# Patient Record
Sex: Female | Born: 1985
Health system: Southern US, Community
[De-identification: ages and names within clinical notes are randomized; demographics above are authoritative.]

## PROBLEM LIST (undated history)

## (undated) DIAGNOSIS — K8020 Calculus of gallbladder without cholecystitis without obstruction: Secondary | ICD-10-CM

## (undated) DIAGNOSIS — I10 Essential (primary) hypertension: Secondary | ICD-10-CM

## (undated) HISTORY — DX: Calculus of gallbladder without cholecystitis without obstruction: K80.20

## (undated) HISTORY — DX: Essential (primary) hypertension: I10

## (undated) HISTORY — PX: NO PAST SURGERIES: SHX2092

## (undated) HISTORY — PX: UPPER GASTROINTESTINAL ENDOSCOPY: SHX188

---

## 2004-10-20 ENCOUNTER — Encounter (INDEPENDENT_AMBULATORY_CARE_PROVIDER_SITE_OTHER): Payer: Self-pay | Admitting: *Deleted

## 2004-10-20 LAB — CONVERTED CEMR LAB

## 2004-10-21 ENCOUNTER — Inpatient Hospital Stay (HOSPITAL_COMMUNITY): Admission: AD | Admit: 2004-10-21 | Discharge: 2004-10-21 | Payer: Self-pay | Admitting: Obstetrics & Gynecology

## 2004-10-25 ENCOUNTER — Ambulatory Visit: Payer: Self-pay | Admitting: Family Medicine

## 2004-11-01 ENCOUNTER — Ambulatory Visit: Payer: Self-pay | Admitting: Sports Medicine

## 2004-11-04 ENCOUNTER — Ambulatory Visit (HOSPITAL_COMMUNITY): Admission: RE | Admit: 2004-11-04 | Discharge: 2004-11-04 | Payer: Self-pay | Admitting: Family Medicine

## 2004-12-25 ENCOUNTER — Ambulatory Visit: Payer: Self-pay | Admitting: Family Medicine

## 2005-01-23 ENCOUNTER — Ambulatory Visit: Payer: Self-pay | Admitting: Family Medicine

## 2005-02-06 ENCOUNTER — Ambulatory Visit: Payer: Self-pay | Admitting: Family Medicine

## 2005-02-25 ENCOUNTER — Ambulatory Visit: Payer: Self-pay | Admitting: Family Medicine

## 2005-03-10 ENCOUNTER — Ambulatory Visit: Payer: Self-pay | Admitting: Family Medicine

## 2005-03-21 ENCOUNTER — Ambulatory Visit: Payer: Self-pay | Admitting: Family Medicine

## 2005-03-26 ENCOUNTER — Ambulatory Visit: Payer: Self-pay | Admitting: Family Medicine

## 2005-04-02 ENCOUNTER — Ambulatory Visit: Payer: Self-pay | Admitting: Family Medicine

## 2005-04-05 ENCOUNTER — Ambulatory Visit: Payer: Self-pay | Admitting: Obstetrics and Gynecology

## 2005-04-05 ENCOUNTER — Inpatient Hospital Stay (HOSPITAL_COMMUNITY): Admission: AD | Admit: 2005-04-05 | Discharge: 2005-04-08 | Payer: Self-pay | Admitting: *Deleted

## 2005-05-23 ENCOUNTER — Ambulatory Visit: Payer: Self-pay | Admitting: Family Medicine

## 2005-07-28 ENCOUNTER — Ambulatory Visit: Payer: Self-pay | Admitting: Family Medicine

## 2005-08-07 ENCOUNTER — Ambulatory Visit: Payer: Self-pay | Admitting: Family Medicine

## 2005-09-14 ENCOUNTER — Emergency Department (HOSPITAL_COMMUNITY): Admission: EM | Admit: 2005-09-14 | Discharge: 2005-09-14 | Payer: Self-pay | Admitting: Family Medicine

## 2005-11-19 ENCOUNTER — Ambulatory Visit: Payer: Self-pay | Admitting: Family Medicine

## 2006-12-18 ENCOUNTER — Encounter (INDEPENDENT_AMBULATORY_CARE_PROVIDER_SITE_OTHER): Payer: Self-pay | Admitting: *Deleted

## 2007-12-21 ENCOUNTER — Ambulatory Visit: Payer: Self-pay | Admitting: Internal Medicine

## 2008-02-02 ENCOUNTER — Encounter (INDEPENDENT_AMBULATORY_CARE_PROVIDER_SITE_OTHER): Payer: Self-pay | Admitting: Nurse Practitioner

## 2008-02-02 ENCOUNTER — Ambulatory Visit: Payer: Self-pay | Admitting: *Deleted

## 2008-02-02 ENCOUNTER — Ambulatory Visit: Payer: Self-pay | Admitting: Family Medicine

## 2008-02-02 LAB — CONVERTED CEMR LAB
ALT: 10 units/L (ref 0–35)
AST: 17 units/L (ref 0–37)
Albumin: 4.9 g/dL (ref 3.5–5.2)
Alkaline Phosphatase: 50 units/L (ref 39–117)
BUN: 10 mg/dL (ref 6–23)
Basophils Absolute: 0 10*3/uL (ref 0.0–0.1)
Basophils Relative: 0 % (ref 0–1)
CO2: 20 meq/L (ref 19–32)
Calcium: 9.7 mg/dL (ref 8.4–10.5)
Chloride: 106 meq/L (ref 96–112)
Creatinine, Ser: 0.58 mg/dL (ref 0.40–1.20)
Eosinophils Absolute: 0.2 10*3/uL (ref 0.0–0.7)
Eosinophils Relative: 3 % (ref 0–5)
Glucose, Bld: 78 mg/dL (ref 70–99)
HCT: 43.4 % (ref 36.0–46.0)
Hemoglobin: 14.6 g/dL (ref 12.0–15.0)
Lymphocytes Relative: 29 % (ref 12–46)
Lymphs Abs: 1.7 10*3/uL (ref 0.7–4.0)
MCHC: 33.6 g/dL (ref 30.0–36.0)
MCV: 90.8 fL (ref 78.0–100.0)
Monocytes Absolute: 0.4 10*3/uL (ref 0.1–1.0)
Monocytes Relative: 7 % (ref 3–12)
Neutro Abs: 3.7 10*3/uL (ref 1.7–7.7)
Neutrophils Relative %: 61 % (ref 43–77)
Platelets: 209 10*3/uL (ref 150–400)
Potassium: 4 meq/L (ref 3.5–5.3)
RBC: 4.78 M/uL (ref 3.87–5.11)
RDW: 13.5 % (ref 11.5–15.5)
Sodium: 141 meq/L (ref 135–145)
TSH: 1.836 microintl units/mL (ref 0.350–5.50)
Total Bilirubin: 0.6 mg/dL (ref 0.3–1.2)
Total Protein: 7.9 g/dL (ref 6.0–8.3)
WBC: 6 10*3/uL (ref 4.0–10.5)

## 2008-02-03 ENCOUNTER — Encounter (INDEPENDENT_AMBULATORY_CARE_PROVIDER_SITE_OTHER): Payer: Self-pay | Admitting: Nurse Practitioner

## 2008-02-07 ENCOUNTER — Ambulatory Visit: Payer: Self-pay | Admitting: Internal Medicine

## 2008-03-21 ENCOUNTER — Ambulatory Visit: Payer: Self-pay | Admitting: Internal Medicine

## 2008-04-14 ENCOUNTER — Ambulatory Visit: Payer: Self-pay | Admitting: Internal Medicine

## 2008-10-04 ENCOUNTER — Encounter (INDEPENDENT_AMBULATORY_CARE_PROVIDER_SITE_OTHER): Payer: Self-pay | Admitting: Adult Health

## 2008-10-04 ENCOUNTER — Ambulatory Visit: Payer: Self-pay | Admitting: Internal Medicine

## 2008-10-04 LAB — CONVERTED CEMR LAB
ALT: 12 units/L (ref 0–35)
AST: 15 units/L (ref 0–37)
Albumin: 5.2 g/dL (ref 3.5–5.2)
Alkaline Phosphatase: 51 units/L (ref 39–117)
BUN: 11 mg/dL (ref 6–23)
Basophils Absolute: 0 10*3/uL (ref 0.0–0.1)
Basophils Relative: 0 % (ref 0–1)
CO2: 22 meq/L (ref 19–32)
Calcium: 9.8 mg/dL (ref 8.4–10.5)
Chloride: 103 meq/L (ref 96–112)
Creatinine, Ser: 0.53 mg/dL (ref 0.40–1.20)
Eosinophils Absolute: 0.2 10*3/uL (ref 0.0–0.7)
Eosinophils Relative: 3 % (ref 0–5)
Glucose, Bld: 76 mg/dL (ref 70–99)
HCT: 44 % (ref 36.0–46.0)
Hemoglobin: 14.7 g/dL (ref 12.0–15.0)
Lymphocytes Relative: 31 % (ref 12–46)
Lymphs Abs: 1.9 10*3/uL (ref 0.7–4.0)
MCHC: 33.4 g/dL (ref 30.0–36.0)
MCV: 89.1 fL (ref 78.0–100.0)
Mono Screen: NEGATIVE
Monocytes Absolute: 0.5 10*3/uL (ref 0.1–1.0)
Monocytes Relative: 8 % (ref 3–12)
Neutro Abs: 3.5 10*3/uL (ref 1.7–7.7)
Neutrophils Relative %: 58 % (ref 43–77)
Platelets: 238 10*3/uL (ref 150–400)
Potassium: 3.9 meq/L (ref 3.5–5.3)
RBC: 4.94 M/uL (ref 3.87–5.11)
RDW: 13.1 % (ref 11.5–15.5)
Sodium: 140 meq/L (ref 135–145)
Total Bilirubin: 0.4 mg/dL (ref 0.3–1.2)
Total Protein: 8.4 g/dL — ABNORMAL HIGH (ref 6.0–8.3)
WBC: 6 10*3/uL (ref 4.0–10.5)

## 2008-10-17 ENCOUNTER — Ambulatory Visit: Payer: Self-pay | Admitting: Internal Medicine

## 2008-10-18 ENCOUNTER — Ambulatory Visit (HOSPITAL_COMMUNITY): Admission: RE | Admit: 2008-10-18 | Discharge: 2008-10-18 | Payer: Self-pay | Admitting: Family Medicine

## 2009-02-27 ENCOUNTER — Encounter (INDEPENDENT_AMBULATORY_CARE_PROVIDER_SITE_OTHER): Payer: Self-pay | Admitting: Adult Health

## 2009-02-27 ENCOUNTER — Ambulatory Visit: Payer: Self-pay | Admitting: Internal Medicine

## 2009-08-15 ENCOUNTER — Telehealth (INDEPENDENT_AMBULATORY_CARE_PROVIDER_SITE_OTHER): Payer: Self-pay | Admitting: Adult Health

## 2009-08-29 ENCOUNTER — Emergency Department (HOSPITAL_COMMUNITY): Admission: EM | Admit: 2009-08-29 | Discharge: 2009-08-29 | Payer: Self-pay | Admitting: Family Medicine

## 2009-09-09 ENCOUNTER — Emergency Department (HOSPITAL_COMMUNITY): Admission: EM | Admit: 2009-09-09 | Discharge: 2009-09-09 | Payer: Self-pay | Admitting: Family Medicine

## 2009-09-19 ENCOUNTER — Ambulatory Visit: Payer: Self-pay | Admitting: Internal Medicine

## 2009-09-19 ENCOUNTER — Encounter (INDEPENDENT_AMBULATORY_CARE_PROVIDER_SITE_OTHER): Payer: Self-pay | Admitting: Adult Health

## 2009-09-19 ENCOUNTER — Emergency Department (HOSPITAL_COMMUNITY): Admission: EM | Admit: 2009-09-19 | Discharge: 2009-09-19 | Payer: Self-pay | Admitting: Family Medicine

## 2009-09-19 LAB — CONVERTED CEMR LAB
ALT: 16 units/L (ref 0–35)
AST: 16 units/L (ref 0–37)
Albumin: 5.4 g/dL — ABNORMAL HIGH (ref 3.5–5.2)
Alkaline Phosphatase: 58 units/L (ref 39–117)
BUN: 12 mg/dL (ref 6–23)
Basophils Absolute: 0 10*3/uL (ref 0.0–0.1)
Basophils Relative: 0 % (ref 0–1)
CO2: 22 meq/L (ref 19–32)
Calcium: 10 mg/dL (ref 8.4–10.5)
Chloride: 101 meq/L (ref 96–112)
Creatinine, Ser: 0.62 mg/dL (ref 0.40–1.20)
Eosinophils Absolute: 0 10*3/uL (ref 0.0–0.7)
Eosinophils Relative: 0 % (ref 0–5)
Glucose, Bld: 73 mg/dL (ref 70–99)
HCT: 45.3 % (ref 36.0–46.0)
Hemoglobin: 15.1 g/dL — ABNORMAL HIGH (ref 12.0–15.0)
Lymphocytes Relative: 6 % — ABNORMAL LOW (ref 12–46)
Lymphs Abs: 0.9 10*3/uL (ref 0.7–4.0)
MCHC: 33.3 g/dL (ref 30.0–36.0)
MCV: 89.3 fL (ref 78.0–100.0)
Monocytes Absolute: 0.9 10*3/uL (ref 0.1–1.0)
Monocytes Relative: 7 % (ref 3–12)
Neutro Abs: 12.2 10*3/uL — ABNORMAL HIGH (ref 1.7–7.7)
Neutrophils Relative %: 87 % — ABNORMAL HIGH (ref 43–77)
Platelets: 208 10*3/uL (ref 150–400)
Potassium: 4 meq/L (ref 3.5–5.3)
RBC: 5.07 M/uL (ref 3.87–5.11)
RDW: 13 % (ref 11.5–15.5)
Sodium: 138 meq/L (ref 135–145)
TSH: 0.741 microintl units/mL (ref 0.350–4.500)
Total Bilirubin: 0.6 mg/dL (ref 0.3–1.2)
Total Protein: 8.3 g/dL (ref 6.0–8.3)
WBC: 14.1 10*3/uL — ABNORMAL HIGH (ref 4.0–10.5)

## 2009-09-26 ENCOUNTER — Ambulatory Visit: Payer: Self-pay | Admitting: Internal Medicine

## 2009-10-02 ENCOUNTER — Encounter (INDEPENDENT_AMBULATORY_CARE_PROVIDER_SITE_OTHER): Payer: Self-pay | Admitting: Adult Health

## 2009-10-02 ENCOUNTER — Ambulatory Visit: Payer: Self-pay | Admitting: Internal Medicine

## 2009-10-02 LAB — CONVERTED CEMR LAB
Basophils Absolute: 0 10*3/uL (ref 0.0–0.1)
Basophils Relative: 0 % (ref 0–1)
Eosinophils Absolute: 0.1 10*3/uL (ref 0.0–0.7)
Eosinophils Relative: 1 % (ref 0–5)
HCT: 43.3 % (ref 36.0–46.0)
Hemoglobin: 14.4 g/dL (ref 12.0–15.0)
Lymphocytes Relative: 18 % (ref 12–46)
Lymphs Abs: 1.6 10*3/uL (ref 0.7–4.0)
MCHC: 33.3 g/dL (ref 30.0–36.0)
MCV: 90 fL (ref 78.0–100.0)
Monocytes Absolute: 0.4 10*3/uL (ref 0.1–1.0)
Monocytes Relative: 4 % (ref 3–12)
Neutro Abs: 7 10*3/uL (ref 1.7–7.7)
Neutrophils Relative %: 76 % (ref 43–77)
Platelets: 243 10*3/uL (ref 150–400)
RBC: 4.81 M/uL (ref 3.87–5.11)
RDW: 13.2 % (ref 11.5–15.5)
WBC: 9.2 10*3/uL (ref 4.0–10.5)

## 2009-10-17 ENCOUNTER — Ambulatory Visit: Payer: Self-pay | Admitting: Internal Medicine

## 2009-12-07 ENCOUNTER — Ambulatory Visit: Payer: Self-pay | Admitting: Internal Medicine

## 2010-01-02 ENCOUNTER — Ambulatory Visit: Payer: Self-pay | Admitting: Internal Medicine

## 2010-01-22 ENCOUNTER — Ambulatory Visit: Payer: Self-pay | Admitting: Internal Medicine

## 2010-02-06 ENCOUNTER — Ambulatory Visit: Payer: Self-pay | Admitting: Internal Medicine

## 2010-02-20 ENCOUNTER — Ambulatory Visit: Payer: Self-pay | Admitting: Internal Medicine

## 2010-02-22 ENCOUNTER — Ambulatory Visit (HOSPITAL_COMMUNITY): Admission: RE | Admit: 2010-02-22 | Discharge: 2010-02-22 | Payer: Self-pay | Admitting: Internal Medicine

## 2010-05-01 ENCOUNTER — Ambulatory Visit: Payer: Self-pay | Admitting: Internal Medicine

## 2010-08-01 IMAGING — CR DG CHEST 2V
2 series · 2 of 2 positions shown · non-contrast
Comparison: None

CLINICAL DATA: Cervical lymphadenopathy

CHEST - 2 VIEW

[w chest pa]
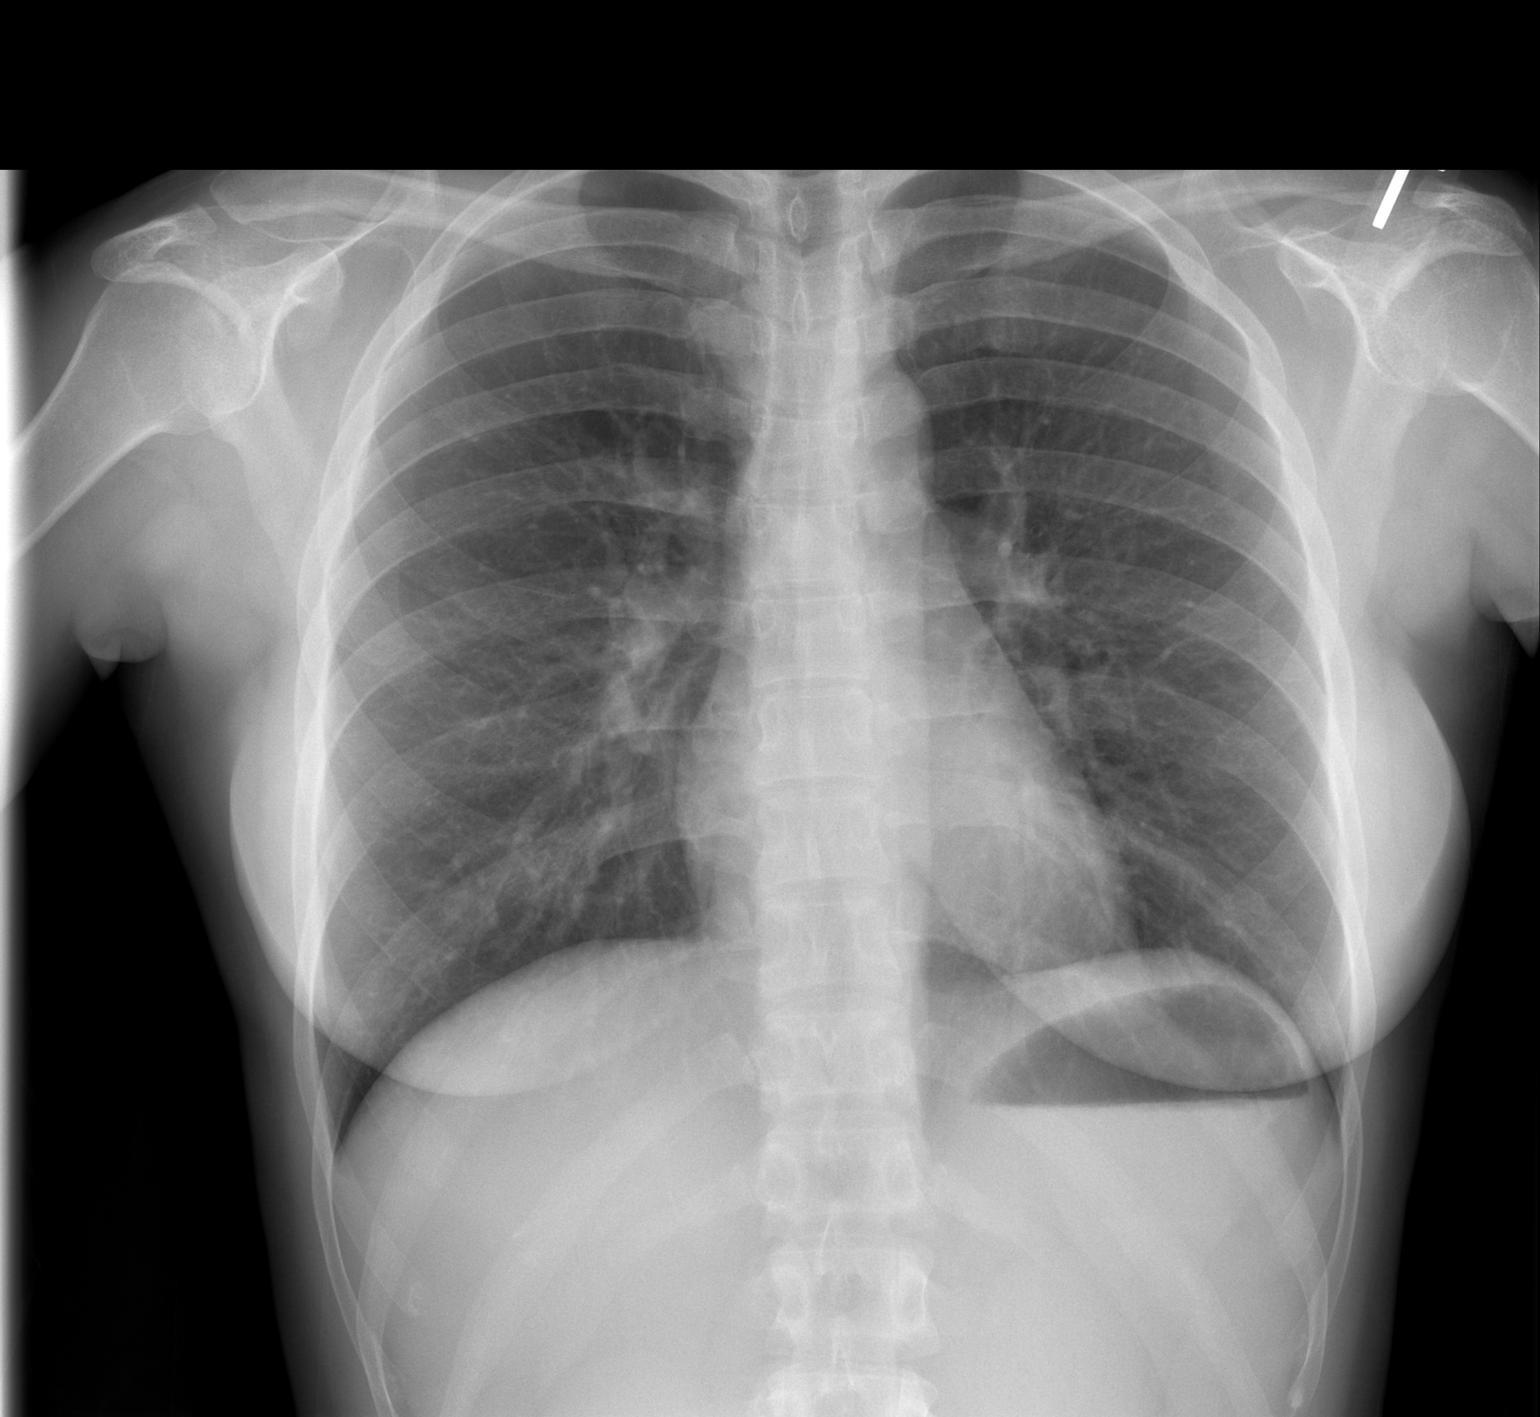

[w chest lat]
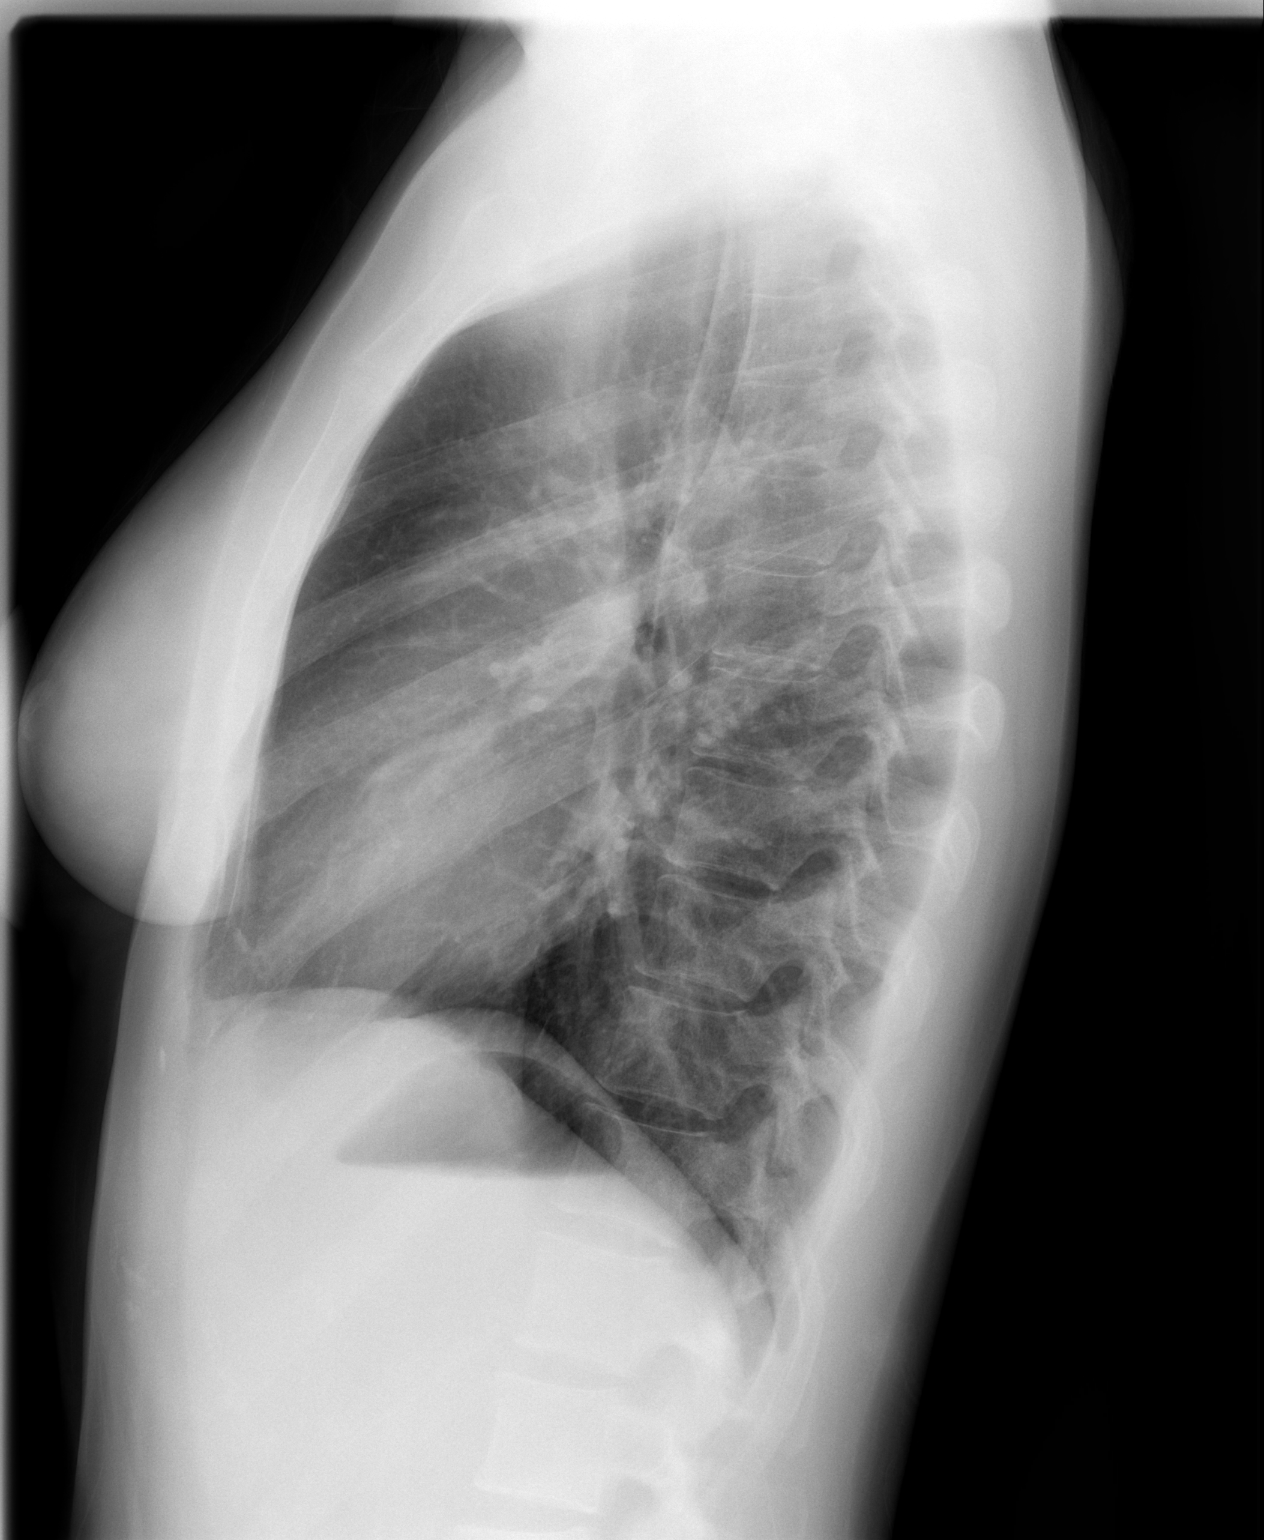

[2 of 2 positions shown; findings below may reference images not displayed]

FINDINGS: Normal mediastinum and cardiac silhouette.  Costophrenic
angles are clear.  No evidence of effusion, infiltrate, or
pneumothorax.
IMPRESSION: No acute cardiopulmonary process.

## 2010-08-13 ENCOUNTER — Encounter: Payer: Self-pay | Admitting: Family Medicine

## 2010-08-13 ENCOUNTER — Ambulatory Visit: Payer: Self-pay | Admitting: Family Medicine

## 2010-08-13 LAB — CONVERTED CEMR LAB
Antibody Screen: NEGATIVE
Basophils Absolute: 0 10*3/uL (ref 0.0–0.1)
Basophils Relative: 0 % (ref 0–1)
Eosinophils Absolute: 0.1 10*3/uL (ref 0.0–0.7)
Eosinophils Relative: 1 % (ref 0–5)
HCT: 40.5 % (ref 36.0–46.0)
HIV: NONREACTIVE
Hemoglobin: 14.1 g/dL (ref 12.0–15.0)
Hepatitis B Surface Ag: NEGATIVE
Lymphocytes Relative: 13 % (ref 12–46)
Lymphs Abs: 1.2 10*3/uL (ref 0.7–4.0)
MCHC: 34.8 g/dL (ref 30.0–36.0)
MCV: 88 fL (ref 78.0–100.0)
Monocytes Absolute: 0.5 10*3/uL (ref 0.1–1.0)
Monocytes Relative: 5 % (ref 3–12)
Neutro Abs: 7.4 10*3/uL (ref 1.7–7.7)
Neutrophils Relative %: 81 % — ABNORMAL HIGH (ref 43–77)
Platelets: 206 10*3/uL (ref 150–400)
RBC: 4.6 M/uL (ref 3.87–5.11)
RDW: 12.9 % (ref 11.5–15.5)
Rh Type: POSITIVE
Rubella: 161.8 intl units/mL — ABNORMAL HIGH
Sickle Cell Screen: NEGATIVE
WBC: 9.1 10*3/uL (ref 4.0–10.5)

## 2010-08-13 LAB — CBC
Hemoglobin: 14.1 g/dL (ref 12.0–16.0)
Platelets: 206 10*3/uL (ref 150–399)

## 2010-08-13 LAB — RPR

## 2010-08-20 ENCOUNTER — Encounter: Payer: Self-pay | Admitting: Family Medicine

## 2010-08-20 ENCOUNTER — Ambulatory Visit: Payer: Self-pay | Admitting: Family Medicine

## 2010-08-20 DIAGNOSIS — N760 Acute vaginitis: Secondary | ICD-10-CM | POA: Insufficient documentation

## 2010-08-20 LAB — CONVERTED CEMR LAB
Chlamydia, DNA Probe: NEGATIVE
GC Probe Amp, Genital: NEGATIVE
Whiff Test: NEGATIVE

## 2010-08-20 LAB — VARICELLA ZOSTER ANTIBODY, IGG: Varicella IgG: IMMUNE

## 2010-08-27 ENCOUNTER — Ambulatory Visit (HOSPITAL_COMMUNITY): Admission: RE | Admit: 2010-08-27 | Discharge: 2010-08-27 | Payer: Self-pay | Admitting: Family Medicine

## 2010-09-17 ENCOUNTER — Ambulatory Visit: Payer: Self-pay | Admitting: Family Medicine

## 2010-09-19 ENCOUNTER — Ambulatory Visit (HOSPITAL_COMMUNITY): Admission: RE | Admit: 2010-09-19 | Discharge: 2010-09-19 | Payer: Self-pay | Admitting: Family Medicine

## 2010-09-19 ENCOUNTER — Encounter: Payer: Self-pay | Admitting: Family Medicine

## 2010-10-15 ENCOUNTER — Ambulatory Visit: Payer: Self-pay | Admitting: Family Medicine

## 2010-10-22 ENCOUNTER — Ambulatory Visit: Admission: RE | Admit: 2010-10-22 | Discharge: 2010-10-22 | Payer: Self-pay | Source: Home / Self Care

## 2010-10-24 ENCOUNTER — Encounter: Payer: Self-pay | Admitting: Family Medicine

## 2010-10-24 ENCOUNTER — Ambulatory Visit: Admission: RE | Admit: 2010-10-24 | Discharge: 2010-10-24 | Payer: Self-pay | Source: Home / Self Care

## 2010-10-24 DIAGNOSIS — O99810 Abnormal glucose complicating pregnancy: Secondary | ICD-10-CM | POA: Insufficient documentation

## 2010-10-24 LAB — GLUCOSE, CAPILLARY: Glucose-Capillary: 85 mg/dL (ref 70–99)

## 2010-11-14 ENCOUNTER — Encounter: Payer: Self-pay | Admitting: Family Medicine

## 2010-11-14 ENCOUNTER — Ambulatory Visit: Admission: RE | Admit: 2010-11-14 | Discharge: 2010-11-14 | Payer: Self-pay | Source: Home / Self Care

## 2010-11-14 LAB — CONVERTED CEMR LAB
HCT: 36 % (ref 36.0–46.0)
HIV: NONREACTIVE
Hemoglobin: 12 g/dL (ref 12.0–15.0)
MCHC: 33.3 g/dL (ref 30.0–36.0)
MCV: 88.7 fL (ref 78.0–100.0)
Platelets: 198 10*3/uL (ref 150–400)
RBC: 4.06 M/uL (ref 3.87–5.11)
RDW: 13 % (ref 11.5–15.5)
WBC: 9.2 10*3/uL (ref 4.0–10.5)

## 2010-11-19 NOTE — Assessment & Plan Note (Signed)
Summary: ob visit/matthews pt/rescheduled from 11/28/eo   Vital Signs:  Patient profile:   25 year old female Weight:      129 pounds Temp:     98.3 degrees F oral Pulse rate:   86 / minute BP sitting:   120 / 77  (left arm) Cuff size:   regular  Vitals Entered By: Tessie Fass CMA (September 17, 2010 2:28 PM) CC: OB Visit 18.1   CC:  OB Visit 18.1.  Habits & Providers  Alcohol-Tobacco-Diet     Cigarette Packs/Day: n/a  Current Medications (verified): 1)  Pre-Natal Formula  Tabs (Prenatal Multivit-Min-Fe-Fa) .Marland Kitchen.. 1 Tab By Mouth Daily  Allergies (verified): No Known Drug Allergies   Impression & Recommendations:  Problem # 1:  PREGNANT STATE, INCIDENTAL (ICD-V22.2) Assessment Unchanged No concerns today. N/V improving. Reviewed labs and previous US. Ordered anatomy scan. Continue PNV. Declined QUAD screen. Follow up with PCP in 4 weeks. Consider early glucola. Orders: Other OB visit- [facility] (OBCK)  Complete Medication List: 1)  Pre-natal Formula Tabs (Prenatal multivit-min-fe-fa) .Marland Kitchen.. 1 tab by mouth daily  Other Orders: Prenatal U/S > 14 weeks - 25956 (Prenatal U/S)  Patient Instructions: 1)  Fue un placer conocerte hoy. Tambin programar el ultrasonido y la enfermera le har saber la fecha. Me gustara volver a verte en 4 semanas. Recuerde que si usted tiene alguna fuga de lquido o sangrado pesado ir al hospital [facility] [place].   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    Number of children at home: 1  FOB Information    Husband/Father of baby: Jose  Menstrual History    LMP (date): 05/13/2010    LMP - Character: normal    Menarche: 11 years    Menses interval: 28 days    Menstrual flow 5 days    On BCP's at conception:  no   Flowsheet View for Follow-up Visit    Estimated weeks of       gestation:     18.1    Weight:     129    Blood pressure:   120 / 77    Headache:     No    Nausea/vomiting:   vom1/d    Edema:     0    Vaginal bleeding:   no    Vaginal discharge:   no    Fundal height:      18    FHR:       140s    Fetal activity:     no    Labor symptoms:   no    Taking prenatal vits?   Y    Smoking:     n/a    Next visit:     4 wk    Resident:     Earlene Plater    Comment:     Doing well.    Flowsheet View for Follow-up Visit    Estimated weeks of       gestation:     18.1    Weight:     129    Blood pressure:   120 / 77    Hx headache?     No  Nausea/vomiting?   vom1/d    Edema?     0    Bleeding?     no    Leakage/discharge?   no    Fetal activity:       no    Labor symptoms?   no    Fundal height:      18    FHR:       140s    Taking Vitamins?   Y    Smoking PPD:   n/a    Comment:     Doing well.    Next visit:     4 wk    Resident:     Earlene Plater

## 2010-11-19 NOTE — Assessment & Plan Note (Signed)
Summary: NOB/AAM/DSL   Vital Signs:  Patient profile:   25 year old female LMP:     05/13/2010 Weight:      129 pounds Temp:     98.0 degrees F oral Pulse rate:   97 / minute BP sitting:   119 / 84  (left arm) Cuff size:   regular  Vitals Entered By: Jimmy Footman, CMA (August 20, 2010 2:24 PM)  Menstrual History Menarche (age onset-years):  11 Menses Interval (days):  28 Menstrual Flow (days):  5 CC: New OB Is Patient Diabetic? No Pain Assessment Patient in pain? no      LMP (date): 05/13/2010 EDC by LMP==> 02/17/2011 LMP - Character: normal LMP - Reliable? Yes Menarche (age onset years): 11   Menses interval (days): 28 Menstrual flow (days): 5 On BCP's at conception: no Enter LMP: 05/13/2010 Last PAP Result NEGATIVE FOR INTRAEPITHELIAL LESIONS OR MALIGNANCY.   CC:  New OB.  History of Present Illness: History obtained through interpreter. Patient here for new ob visit.  First day of LMP was 05/13/10.  Has had two positive pregnancy tests at home.  Has had some vomiting at home occasionally, does not currenly want anything for this.  Also has some upper airway congestion that started two days ago.  No fever/ chills, headache, shortness of breath, chest pain, abdominal pain.  Is taking an prenatal vitamin  Preventive Screening-Counseling & Management  Alcohol-Tobacco     Smoking Status: never     Packs/Day: n/a  Hep-HIV-STD-Contraception     Hepatitis Risk: no  Current Medications (verified): 1)  Pre-Natal Formula  Tabs (Prenatal Multivit-Min-Fe-Fa) .Marland Kitchen.. 1 Tab By Mouth Daily  Allergies (verified): No Known Drug Allergies  Past History:  Past Medical History: G2P1  Family History: Mother-arthritis Mother and father currently living  Social History: Lives w/boyfriend, son born 04/06/05; unemployed,no tobacco, no EtOHSmoking Status:  never Education:  High School Hepatitis Risk:  no Packs/Day:  n/a  Review of Systems       Pertinent positives and  negatives noted in HPI, Vitals signs noted   Physical Exam  General:  Well-developed,well-nourished,in no acute distress; alert,appropriate and cooperative throughout examination Head:  Normocephalic and atraumatic without obvious abnormalities. No apparent alopecia or balding. Eyes:  No corneal or conjunctival inflammation noted. EOMI. Perrla. Funduscopic exam benign, without hemorrhages, exudates or papilledema. Vision grossly normal. Ears:  External ear exam shows no significant lesions or deformities.  Otoscopic examination reveals clear canals, tympanic membranes are intact bilaterally without bulging, retraction, inflammation or discharge. Hearing is grossly normal bilaterally. Nose:  External nasal examination shows no deformity or inflammation. Nasal mucosa are pink and moist without lesions or exudates. Mouth:  Oral mucosa and oropharynx without lesions or exudates.  Teeth in good repair. Neck:  No deformities, masses, or tenderness noted. Lungs:  Normal respiratory effort, chest expands symmetrically. Lungs are clear to auscultation, no crackles or wheezes. Heart:  Normal rate and regular rhythm. S1 and S2 normal without gallop, murmur, click, rub or other extra sounds. Abdomen:  Gravid with uterus 5cm below umbilicus, non tender, no organomegaly, BS+ Genitalia:  Pelvic Exam:        External: normal female genitalia without lesions or masses        Vagina: Mild white discharge        Cervix:  friable without lesions        Adnexa: normal bimanual exam without masses or fullness        Uterus: Gravid  Pap smear: performed Pulses:  R and L carotid,radial,femoral,dorsalis pedis and posterior tibial pulses are full and equal bilaterally Extremities:  No clubbing, cyanosis, edema, or deformity noted with normal full range of motion of all joints.   Neurologic:  No cranial nerve deficits noted. Station and gait are normal. Plantar reflexes are down-going bilaterally. DTRs are  symmetrical throughout. Sensory, motor and coordinative functions appear intact. Skin:  Intact without suspicious lesions or rashes Psych:  Cognition and judgment appear intact. Alert and cooperative with normal attention span and concentration. No apparent delusions, illusions, hallucinations   Impression & Recommendations:  Problem # 1:  PREGNANT STATE, INCIDENTAL (ICD-V22.2) New OB patient at 14wk1day by LMP, intake and history done today as well as testing for GC/Chlamydia, wet prep for vaginal discharge, pap, and varicella titer.  Fetus visualized on U/S with heartbeat.  Uterus consistent with dates.  Will get U/S to   Other prenatal labs wnl.  Declined any medication for n/v.  Taking prenatal vitamin, will have her f/u in 4 weeks. Orders: Miscellaneous Lab Charge-[facility] [phone]) Prenatal U/S > 14 weeks - [id number] (Prenatal U/S)  Complete Medication List: 1)  Pre-natal Formula Tabs (Prenatal multivit-min-fe-fa) .Marland Kitchen.. 1 tab by mouth daily  Other Orders: Wet Prep- [facility] [phone]) GC/Chlamydia-[facility] ([id number]) Pap Smear-[facility] [phone]) Flu Vaccine 46yrs + [phone]) Admin 1st Vaccine ([id number])  Patient Instructions: 1)  Fue un placer conocerte hoy. Yo le har saber los resultados de sus laboratorios. Tambin programar el ultrasonido y la enfermera le har saber la fecha. Me gustara volver a verte en 4 semanas. Recuerde que si usted tiene alguna fuga de lquido o sangrado pesado ir al hospital de la [facility].   Orders Added: 1)  Wet Prep- [facility] [[id number]] 2)  GC/Chlamydia-[facility] [87591/87491] 3)  Pap Smear-[facility] [[id number]] 4)  Miscellaneous Lab Charge-[facility] [99999] 5)  Prenatal U/S > 14 weeks - 01093 [Prenatal U/S] 6)  Flu Vaccine 47yrs + [90658] 7)  Admin 1st Vaccine [90471]   Immunizations Administered:  Influenza Vaccine # 1:    Vaccine Type: Fluvax 3+    Site: left deltoid    Mfr: GlaxoSmithKline    Dose: 0.5 ml    Route: IM    Given by: Jimmy Footman, CMA    Exp. Date: 04/16/2011    Lot #:  [id number]    VIS given: 05/14/10 version given August 20, 2010.  Flu Vaccine Consent Questions:    Do you have a history of severe allergic reactions to this vaccine? no    Any prior history of allergic reactions to egg and/or gelatin? no    Do you have a sensitivity to the preservative Thimersol? no    Do you have a past history of Guillan-Barre Syndrome? no    Do you currently have an acute febrile illness? no    Have you ever had a severe reaction to latex? no    Vaccine information given and explained to patient? yes    Are you currently pregnant? yes   Immunizations Administered:  Influenza Vaccine # 1:    Vaccine Type: Fluvax 3+    Site: left deltoid    Mfr: GlaxoSmithKline    Dose: 0.5 ml    Route: IM    Given by: Jimmy Footman, CMA    Exp. Date: 04/16/2011    Lot #: [id number]    VIS given: 05/14/10 version given August 20, 2010.     Flowsheet View for Follow-up Visit    Weight:     129  Blood pressure:   119 / 84    Headache:     No    Nausea/vomiting:   vom1/d    Edema:     0    Vaginal bleeding:   no    Vaginal discharge:   d/c    Fundal height:      14    FHR:       160    Fetal activity:     N/A    Labor symptoms:   no    Fetal position:     N/A    Cx Dilation:     0    Cx Effacement:   0%    Cx Station:     high    Taking prenatal vits?   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 : Yes    Menarche: 11 years    Menses interval: 28 days    Menstrual flow 5 days    On BCP's at conception: no    Symptoms since LMP: amenorrhea, nausea,  vomiting, fatigue, urinary frequency  Prenatal Visit    FOB name: Elita Quick [facility] Confirmation:    LMP reliable? Yes    Last menses onset (LMP) date: 05/13/2010    EDC by LMP: 02/17/2011   Past Pregnancy History    Gravida:     2    Term Births:     1    Premature Births:   0    Living Children:   1    Para:       1    Mult. Births:     0    Prev C-Section:   0    Aborta:     0    Elect. Ab:     0    Spont. Ab:     0  Pregnancy # 1    Delivery date:     04/06/2005    Weeks Gestation:   40    Preterm labor:     no    Delivery type:     NSVD    Hours of labor:     15    Anesthesia type:     epidural    Delivery location:     Womens    Infant Sex:     Female    Birth weight:     6lbs    Comments:     No complications   Genetic History    Genetic History Reviewed:     Thalassemia:     mother: no    Neural tube defect:   mother: no    Down's Syndrome:   mother: no    Tay-Sachs:     mother: no    Sickle Cell Dz/Trait:   mother: no    Hemophilia:     mother: no  Muscular Dystrophy:   mother: no    Cystic Fibrosis:   mother: no    Huntington's Dz:   mother: no    Mental Retardation:   mother: no    Fragile X:     mother: no    Other Genetic or       Chromosomal Dz:   mother: no    Child with other       birth defect:     mother: no    > 3 spont. abortions:   mother: no    Hx of stillbirth:     mother: no  Infection Risk History    Infection Risk History Reviewed:    High Risk Hepatitis B: no    Exposure to TB: no    Patient with history of Genital Herpes: no    Sexual partner with history of Genital Herpes: no    History of STD (GC, Chlamydia, Syphilis, HPV): no    Rash, Viral, or Febrile Illness since LMP: no    Exposure to Cat Litter: no    Chicken Pox Immune Status: Hx of Disease: Immune    History of Parvovirus (Fifth Disease): no    Occupational Exposure to Children: none  Environmental Exposures    Environmental Exposures Reviewed:    Xray Exposure since  LMP: no    Chemical or other exposure: no    Medication, drug, or alcohol use since LMP: no    Laboratory Results  Date/Time Received: August 20, 2010 3:16 PM  Date/Time Reported: .August 20, 2010 3:28 PM   [employer] Source: vaginal WBC/hpf: >20 Bacteria/hpf: 3+  Rods Clue cells/hpf: none  Negative whiff Yeast/hpf: none Trichomonas/hpf: none Comments: ...........test performed by...........Marland KitchenTerese Door, CMA

## 2010-11-21 NOTE — Assessment & Plan Note (Signed)
Summary: OB 22.1wks   Vital Signs:  Patient profile:   25 year old female Height:      61 inches Weight:      134 pounds BMI:     25.41 Pulse rate:   89 / minute BP sitting:   114 / 75  (left arm) Cuff size:   regular  Vitals Entered By: Tessie Fass CMA (October 15, 2010 2:46 PM) CC: OB Visit LMP (date): 05/13/2010 EDC by LMP==> 02/17/2011 [facility] 02/17/2011   Primary Provider:  Everrett Coombe  CC:  OB Visit.  History of Present Illness: Pt. here for 22 weeks OB follow-up.   Current Medications (verified): 1)  Pre-Natal Formula  Tabs (Prenatal Multivit-Min-Fe-Fa) .Marland Kitchen.. 1 Tab By Mouth Daily 2)  Fluocinonide 0.05 % Soln (Fluocinonide) .... Apply To Aa 2-3 Times Per Day As Needed For Dry/itchy Scalp. Dispense One Bottle  Allergies (verified): No Known Drug Allergies  Physical Exam  General:  Well-developed,well-nourished,in no acute distress; alert,appropriate and cooperative throughout examination Lungs:  Normal respiratory effort, chest expands symmetrically. Lungs are clear to auscultation, no crackles or wheezes. Heart:  Normal rate and regular rhythm. S1 and S2 normal without gallop, murmur, click, rub or other extra sounds. Abdomen:  Gravid, consistent with dates.  BS normal, non-tender. Skin:  Intact without suspicious lesions or rashes   Impression & Recommendations:  Problem # 1:  PREGNANT STATE, INCIDENTAL (ICD-V22.2) Overall patient doing well.  BP stable.  WIll go ahead and order early 1hour GTT with 3 hour if abnormal.  Wanting refill on fluocinonide that she has used before for scalp dermatitis.  Will go ahead and refill this for her.   Will see back in 4 weeks.  Will review labor plan at that point.   Orders: Other OB visit- [facility] (OBCK)Future Orders: Glucose 1 hr-[facility] ([id number]) ... 10/29/2010  Complete Medication List: 1)  Pre-natal Formula Tabs (Prenatal multivit-min-fe-fa) .Marland Kitchen.. 1 tab by mouth daily 2)  Fluocinonide 0.05 % Soln (Fluocinonide) .... Apply to  aa 2-3 times per day as needed for dry/itchy scalp. dispense one bottle  Patient Instructions: 1)  Ha sido un placer ver de nuevo hoy. Quiero que vuelva dentro de una semana para que su prueba de glucosa en una hora por hacer. Llame a la clnica para programar esto con el laboratorio. Corazn de su beb suena muy bien en el efecto Doppler y se vea bien en la ecografa. Usted puede comenzar a sentir que se mueva ms en las prximas semanas. Desde ahora y [place] su prxima cita dentro de cuatro semanas. 2)   Por favor, NO: 3)     Tome baos calientes, baos de jacuzzi 4)     Toque la litera del gato (lavarse las manos despus de Building control surveyor a los gatos) 5)     humo 6)     Beber alcohol 7)     Comer pescado crudo o quesos blandos 8)    9)  Por favor: 10)     Beba mucha agua! (diez cristales de 8 onzas al da) 11)     Tome vitaminas prenatales 12)     Haga ejercicio segn lo tolerado 13)     Coma tres comidas al da adems de meriendas saludables 14)     Use el cinturn de seguridad! 15)   En su prxima cita que se 16)     Hable de anlisis de laboratorio 17)     Escuche los sonidos del corazn 18)     Discuta el  plan de Aleen Campi  Prescriptions: FLUOCINONIDE 0.05 % SOLN (FLUOCINONIDE) Apply to aa 2-3 times per day as needed for dry/itchy scalp. Dispense one bottle  #QS x 1   Entered and Authorized by:   Everrett Coombe DO   Signed by:   Everrett Coombe DO on 10/15/2010   Method used:   Print then Give to Patient   RxID:   [id number] FLUOCINONIDE 0.05 % SOLN (FLUOCINONIDE) Apply to aa 2-3 times per day as needed for dry/itchy scalp. Dispense one bottle  #QS x 1   Entered and Authorized by:   Everrett Coombe DO   Signed by:   Everrett Coombe DO on 10/15/2010   Method used:   Print then Give to Patient   RxID:   [id number]    Orders Added: 1)  Glucose 1 hr-[facility] [82950] 2)  Other OB visit- [facility] [OBCK]     OB Initial Intake Information    Positive HCG by: self    Race: Hispanic     Marital status: Single    Occupation: homemaker    Education (last grade completed): High School    Number of children at home: 1  FOB Information    Husband/Father of baby: Jose  Menstrual History    LMP (date): 05/13/2010    EDC by LMP: 02/17/2011    Best Working EDC: 02/17/2011    LMP - Character: normal    Menarche: 11 years    Menses interval: 28 days    Menstrual flow 5 days    On BCP's at conception: no    Symptoms since LMP: amenorrhea, nausea, fatigue   Flowsheet View for Follow-up Visit    Estimated weeks of       gestation:     22 1/7    Weight:     134    Blood pressure:   114 / 75    Headache:     No    Nausea/vomiting:   nausea    Edema:     0    Vaginal bleeding:   no    Vaginal discharge:   no    Fundal height:      22    FHR:       140s    Fetal activity:     Some    Labor symptoms:   no    Taking prenatal vits?   Y    Smoking:     n/a    Next visit:     4 wk    Resident:     [facility]    Preceptor:     Mauricio Po    Comment:     Doing well, no complaints or concerns  Prenatal Visit      This is a 25 years old female G2, P1, T1, PT0, LC1, Ab0 who is in for a prenatal visit.  Since the last visit, she notes that she is doing well and has no concerns.  EDC Confirmation:    New working [facility]: 02/17/2011    EDC by LMP: 02/17/2011 Ultrasound Dating Information:    First U/S on 09/19/2010   Gest age: [redacted]w[redacted]d   EDC: 02/12/2011.    Genetic History    Genetic History Reviewed:    Father of baby:   Jose     Thalassemia:     mother: no   father: no    Neural tube defect:   mother: no   father: no    Down's Syndrome:   mother:  no   father: no    Tay-Sachs:     mother: no   father: no    Sickle Cell Dz/Trait:   mother: no   father: no    Hemophilia:     mother: no   father: no    Muscular Dystrophy:   mother: no   father: no    Cystic Fibrosis:   mother: no   father: no    Huntington's Dz:   mother: no   father: no    Mental Retardation:   mother: no   father: no     Fragile X:     mother: no   father: no    Other Genetic or       Chromosomal Dz:   mother: no   father: no    Child with other       birth defect:     mother: no   father: no    > 3 spont. abortions:   mother: no    Hx of stillbirth:     mother: no  Infection Risk History    Infection Risk History Reviewed:    High Risk Hepatitis B: no    Exposure to TB: no    Patient with history of Genital Herpes: no    Sexual partner with history of Genital Herpes: no    History of STD (GC, Chlamydia, Syphilis, HPV): no    Rash, Viral, or Febrile Illness since LMP: no    Exposure to Cat Litter: no    Chicken Pox Immune Status: Hx of Disease: Immune    History of Parvovirus (Fifth Desease): no    Occupational Exposure to Children: none  Environmental Exposures    Environmental Exposures Reviewed:    Xray Exposure since LMP: no    Chemical or other exposure: no    Medication, drug, or alcohol use since LMP: no

## 2010-11-25 ENCOUNTER — Encounter: Payer: Self-pay | Admitting: *Deleted

## 2010-11-27 NOTE — Assessment & Plan Note (Signed)
Summary: ob visit- 27 3/7   Vital Signs:  Patient profile:   25 year old female Weight:      138.8 pounds Pulse rate:   94 / minute BP sitting:   125 / 78  (left arm) Cuff size:   regular  Vitals Entered By: Tessie Fass CMA (November 14, 2010 2:16 PM) CC: [place] Visit-27 3/7   Primary Provider:  Everrett Coombe  CC:  OB Visit-27 3/7.  History of Present Illness: 27 week OB f/u. Doing well.  Baby is very active.  Denies vaginal d/c or bleeding.  Is having mild breat tenderness.  Denies contractions or pain. Denies headaches, vision changes.  Plans on Breast/Bottle feeding and using IUD for contraception  Allergies: No Known Drug Allergies  Past History:  Past medical, surgical, family and social histories (including risk factors) reviewed, and no changes noted (except as noted below).  Past Medical History: Reviewed history from 08/20/2010 and no changes required. G2P1  Family History: Reviewed history from 08/20/2010 and no changes required. Mother-arthritis Mother and father currently living  Social History: Reviewed history from 08/20/2010 and no changes required. Lives w/boyfriend, son born 04/06/05; unemployed,no tobacco, no EtOH  Review of Systems       Pertinent positives and negatives noted in HPI, Vitals signs noted   Physical Exam  General:  Well-developed,well-nourished,in no acute distress; alert,appropriate and cooperative throughout examination Neck:  No deformities, masses, or tenderness noted. Lungs:  Normal respiratory effort, chest expands symmetrically. Lungs are clear to auscultation, no crackles or wheezes. Heart:  Normal rate and regular rhythm. S1 and S2 normal without gallop, murmur, click, rub or other extra sounds. Abdomen:  Gravid, + BS.  Fetal movement felt during doppler auscultation Neurologic:  Reflexes 1+ b/l Psych:  Oriented X3, normally interactive, good eye contact, not anxious appearing, not depressed appearing, not suicidal, and not  homicidal.     Flowsheet View for Follow-up Visit    Estimated weeks of       gestation:     27 3/7    Weight:     138.8    Blood pressure:   125 / 78    Headache:     No    Nausea/vomiting:   No    Edema:     0    Vaginal bleeding:   no    Vaginal discharge:   no    Fundal height:      26    FHR:       150    Fetal activity:     yes    Labor symptoms:   no    Fetal position:     N/A    Taking prenatal vits?   Y    Smoking:     n/a    Next visit:     2 wk    Resident:     CEM    Preceptor:     Mauricio Po    Comment:     Doing well.  Baby is very active.  Denies vaginal d/c or bleeding.  Is having mild breat tenderness.  Denies contractions or pain. Denies headaches, vision changes.  Plans on Breast/Bottle feeding and using IUD for contraception   Impression & Recommendations:  Problem # 1:  PREGNANT STATE, INCIDENTAL (ICD-V22.2) Assessment Comment Only Doing well, fetus active and measuring appropriately for gestational age.  Reviewd contraception plans, infant feeding and PTL precautions.  Will have her make appointment to be seen at next  available attending OB clinic.  Will go ahead and check repeat CBC, RPR and HIV today.  Will fax record [facility] to [facility] once lab results return.  PHQ 9 done today with total score of 3, question number 9 with score of 0. Precepted with Dr. Mauricio Po Orders: CBC-[facility] [id number]) RPR-[facility] [phone]) HIV-[facility] ([id number]) Other OB visit- [facility] (OBCK)  Problem # 2:  ABNORMAL MATERNAL GLUCOSE TOLERANCE ANTEPARTUM (ICD-648.83) Abnormal 1 hour glucose at 152, 3 hour recheck normal with measurements of 72-fasting, 124-1 hour, 103-2hours, 47-3 hours.  No need to repeat.  Complete Medication List: 1)  Pre-natal Formula Tabs (Prenatal multivit-min-fe-fa) .Marland Kitchen.. 1 tab by mouth daily 2)  Fluocinonide 0.05 % Soln (Fluocinonide) .... Apply to aa 2-3 times per day as needed for dry/itchy scalp. dispense one bottle  Patient Instructions: 1)  Fue agradable  volver a verle hoy. Su beb suena muy bien y parece que lo ests [employer]. Me gustara que usted haga una cita para la prxima clnica OB que tenemos aqu en el [facility]. Recuerde que si usted tiene sntomas de la mano de obra incluyendo las contracciones, prdida de lquido vaginal, sangrado vaginal, o no se siente a su beb moverse como ir normal a la unidad de maternidad de [employer] de la [place]. Te dejar saber si alguno de sus resultados de laboratorio de esta visita son anormales. Tenga un buen da! 2)  Please make appointment for next available OB clinic.    Orders Added: 1)  CBC-[facility] [[id number]] 2)  RPR-[facility] [[id number]] 3)  HIV-[facility] [[id number]] 4)  Other OB visit- [facility] [OBCK]

## 2010-12-04 ENCOUNTER — Telehealth: Payer: Self-pay | Admitting: Family Medicine

## 2010-12-04 DIAGNOSIS — O99810 Abnormal glucose complicating pregnancy: Secondary | ICD-10-CM

## 2010-12-04 NOTE — Telephone Encounter (Signed)
Call completed in Spanish.  Patient acknowledges appt for tomorrow OB clinic.  I would like to repeat her 3hGTT tomorrow, in light of previous failed 1hr and passed 3hGTT.  She agrees to be fasting after midnight, and will come in between 830 and 9am tomorrow for lab and OB clinic visit.

## 2010-12-05 ENCOUNTER — Ambulatory Visit (INDEPENDENT_AMBULATORY_CARE_PROVIDER_SITE_OTHER): Payer: Self-pay | Admitting: Family Medicine

## 2010-12-05 ENCOUNTER — Encounter: Payer: Self-pay | Admitting: Family Medicine

## 2010-12-05 ENCOUNTER — Other Ambulatory Visit: Payer: Self-pay

## 2010-12-05 VITALS — BP 128/88 | Wt 142.2 lb

## 2010-12-05 DIAGNOSIS — Z3480 Encounter for supervision of other normal pregnancy, unspecified trimester: Secondary | ICD-10-CM

## 2010-12-05 DIAGNOSIS — O99810 Abnormal glucose complicating pregnancy: Secondary | ICD-10-CM

## 2010-12-05 LAB — GLUCOSE, CAPILLARY: Glucose-Capillary: 91 mg/dL (ref 70–99)

## 2010-12-05 NOTE — Progress Notes (Signed)
Subjective:    Maurissa Ambrose is a 25 y.o. female being seen today for her obstetrical visit. She is at [redacted]w[redacted]d gestation. Patient reports no complaints. Fetal movement: normal.  Visit conducted in [place].  Neriah came early for her 3hr GTT, took the glucola and vomited.  Hence, lab to be done at a future date.   Objective:    BP 128/88  Wt 142 lb 4 oz (64.524 kg)  LMP 05/13/2010  Physical Exam  Exam  FHT:  140 BPM  Uterine Size: size equals dates  Presentation: unsure     Assessment:    Pregnancy:  G1P0    Plan:    Patient Active Problem List  Diagnoses  . VAGINITIS  . ABNORMAL MATERNAL GLUCOSE TOLERANCE ANTEPARTUM   Had her 3rd trimester CBC, RPR and HIV, reviewed (done on 11/14/10). Prior failure of 1hGTT, passed early 3hr.  WIll repeat with a 3hrGTT in the future (vomited up glucola today).  Follow up in 2 Weeks.

## 2010-12-05 NOTE — Patient Instructions (Signed)
Fue un Research officer, trade union.  Tiene 29 semanas con 3 dias de embarazo.   Quiero que vuelva para hacer la prueba de la glucosa (la de 3 horas) que requiere que este' en ayunas por 8 horas antes de la prueba.   MAKE FASTING LAB APPT FOR 3 HR GTT. MAKE FOLLOW UP OB VISIT WITH DR MATTHEWS IN 2 WEEKS.  Mtodo para contar los movimientos fetales (Kick Count) (Fetal Movement Counts)   En los embarazos de alto riesgo se recomienda contar las pataditas, pero tambin es una buena idea que lo hagan todas las [place]. Comience a contarlas a las 28 semanas de embarazo. Los movimientos fetales aumentan luego de una comida Immunologist o de comer o beber algo dulce (el nivel de azcar en la sangre est ms alto). Tambin es importante beber gran cantidad de lquidos (hidratarse bien) antes de contar. Si se recuesta sobre el lado izquierdo mejorar la Designer, industrial/product, o puede sentarse en una silla cmoda con los brazos sobre el abdomen y sin distracciones que la rodeen.   CONTANDO:  Trate de contar a la [employer] lo haga.  Anote el da y la hora y luego cuente durante 2 horas. Debe sentir al menos 10 movimientos en 2 horas. Si no los siente, espere una hora y cuente nuevamente. Luego de [employer] tendr un patrn.   Debemos observar si hay cambios en el patrn o no hay suficientes pataditas en 2 horas. Le lleva ms tiempo contar los 10 movimientos?   SOLICITE ATENCIN MDICA SI:  Siente menos de 10 pataditas en 2 horas. Intntelo dos veces.  No siente movimientos durante 1 hora.  El patrn se modifica o le lleva ms tiempo Art gallery manager las 10 pataditas.  Siente que el beb no se mueve como lo hace habitualmente.     [facility]   Movimientos Comienzo hora Fin hora   [place]   Movimientos Comienzo hora Fin hora  S   Dom       [place]        M   Mar       M   Mar        A   Mier       A   Mier        N   Jue       N   Jue        A   Vie       A   Vie             Sab           Sab        [place]        M   Mar       M   Mar        A   Mier       A   Mier        N   Jue       N   Jue        A   [place]  A   Vie            Sab           Sab        S   Dom       [place]        E   Lun       E   Lun        M   Mar       M   Mar        A   Mier       A   Mier        N   Jue       N   Jue        A   Vie       A   Vie            Sab           Sab        [place]        M   Mar       M   Mar        A   Mier       A   Mier        N   Jue       N   Jue        A   Vie       A   Vie            Sab           Sab         Trabajo de parto prematuro,  cuidados en Advice worker (Preterm Labor, Home Care)   Se denomina parto prematuro a las contracciones uterinas que causan la apertura (dilatacin), acortamiento y afinamiento  del cuello del tero, antes de las 37 semanas de [place]. Es la mayor causa de ingreso de mujeres embarazadas al hospital.   [place] causas del [place] de [place] prematuro son:  Katha Hamming de los casos se desencadena por motivos desconocidos.  Pequeas zonas de separacin de la placenta (abrupcin).  Polihidramnios (lquido en exceso en el saco amnitico).  Embarazo de gemelos o ms bebs.  Cerviz incompetente (no puede contener al beb debido a que el tejido es demasiado dbil).  Cambios hormonales.  Hemorragia vaginal en ms de uno de los trimestres.  Infeccin en el crvix, la vagin o la vejiga  El consumo de cigarrillos.  Sindrome antifosfolipdico Se produce cuando los anticuerpos afectan las protenas del organismo.   DIAGNSTICO Los factores que ayudan a predecir el trabajo de parto prematuro son:  Historias de Proofreader con trabajo de parto prematuro.  Vaginosis bacteriana en las mujeres que tuvieron trabajo de [place].  Monitoreo de la actividad uterina que demuestre contracciones uterinas.   La protena fibronectina fetal est elevada en mujeres con historia previa del trastorno.  La medicin del largo del crvix con tcnicas de ultrasonido muestra signos de acortamiento antes de la fecha [place].  Realizando una evaluacin conjunta con fibronectina y ecografa cervical, se puede predecir mejor un parto prematuro inminente.  Otros factores de riesgo son: l No pertenecer  a la raza blanca. l Embarazo a los [address] o [address] l Embarazo a los 35 aos o ms. l Nivel socioeconmico bajo. l Aumento de peso deficiente durante el [place].   PREVENCIN No todos los partos prematuros pueden prevenirse. Algunas contracciones prematuras pueden prevenirse con medidas simples.  Mantngase bien hidratada. Beba 8 vasos de Warehouse manager. Esto disminuye la posibilidad del parto prematuro. El porcentaje de partos prematuros aumenta en los meses de verano. La deshidratacin hace que el volumen de sangre disminuya. Esto aumenta la concentracin de oxitocina (la hormona que produce las contracciones uterinas) en la [place]. La hidratacin ayuda a prevenir este incremento.  Observe si se presentan signos de infeccin. Estos signos incluyen la sensacin de ardor o el aumento de la necesidad de Geographical information systems officer, secrecin vaginal anormal o el aumento de temperatura sin causa aparente. Esto puedo Engineer, drilling.  Concurra puntualmente a las citas con el profesional que la asiste. Comunquese inmediatamente con el mdico si siente contracciones uterinas.  Busque asesoramiento mdico si tiene preguntas o surge algn problema. Es mejor hacerle las preguntas al profesional que tener un trabajo de parto prematuro sin TEFL teacher.   MANEJO DEL PARTO [employer], DENTRO Y [facility] Hay varios cosas para controlar durante el parto prematuro. Entre ellas se incluyen tanto las medidas mdicas como el cuidado personal para usted y su beb. Generalmente este problema se trata en el hospital. Algunas medidas  pueden ser de gran ayuda en el parto prematuro:  Hidratacin (oral o intravenosa) Beba 8 vasos de Warehouse manager.  Haga reposo en cama (en su casa o en el hospital) Puede resultarle de gran ayuda recostarse sobre el lado izquierdo.  Evite las [employer] y los orgasmos.  Algunos medicamentos (antibiticos) la ayudarn en la prevencin de infecciones. Corre ms riesgos si ha roto la bolsa o si las contracciones estn causadas por una infeccin [employer] medicamentos como se le indic.  Evaluacin del beb. Estas pruebas ayudarn al profesional a saber si el beb se encuentra en buen estado y lo que Media planner en caso que se produzca el nacimiento de [place] anticipada. Entre ella se incluyen: l El perfil biofsico l Pruebas de estrs y no estrs. l Amniocentesis, para evaluar la madurez pulmonar del feto. l ndice del volumen del lquido amnitico. l Prueba de ultrasonido.  Hay medicamentos que ayudan a que los pulmones del beb se desarrollen ms rpidamente. Esto puede suceder si el parto prematuro no puede detenerse.  El profesional podr darle otros consejos acerca de la preparacin en caso de un nacimiento prematuro. Puede ser de ayuda si es necesario administrar corticoides para ayudar a la maduracin de los pulmones del beb.  Los tocolticos (frmacos que ayudan a TEFL teacher las contracciones uterinas) pueden ayudar a Veterinary surgeon [address]  [address].  En algunos casos es beneficiosa la administracin de progesterona.   TRATAMIENTO El mejor tratamiento es la prevencin, Solicitor los factores de riesgo y la deteccin temprana. Asegrese de Science writer con el profesional cules son los signos y los sntomas (qu puede ocurrir) del Sport and exercise psychologist, especialmente si ha sufrido un trabajo de parto prematuro en un embarazo previo.   INSTRUCCIONES PARA EL CUIDADO DOMICILIARIO:  Consuma una dieta balanceada y nutritiva.  Tome las vitaminas como se le indic.  Beba entre 6 y 8 vasos  de lquidos por Futures trader.  Descanse y duerma lo suficiente.  No tenga relaciones sexuales si tiene un trabajo de parto prematuro o tiene alto riesgo de  tenerlo.  Siga las recomendaciones de su mdico con respecto a las actividades, [address], anlisis de [place] y otros exmenes (ecografas, amniocentesis).  Evite el estrs.  Evite los trabajos extenuantes o la actividad fsica prolongada si tiene riesgo elevado de parto prematuro.  No fume.   SOLICITE ATENCIN MDICA DE INMEDIATO SI:  Tiene contracciones.  Siente dolor abdominal.  Presenta una hemorragia vaginal abundante.  Siente dolor al [employer].  Observa una hemorragia vaginal anormal.  La temperatura se eleva por encima de 102 F (38,9 C).   Document Released: 07/16/2005  Document Re-Released: 08/03/2009 [facility] Patient Information 2011 [place], [place]. Document Released: 01/13/2008  Document Re-Released: 09/24/2009 [facility] Patient Information 2011 [place], [place].

## 2010-12-09 ENCOUNTER — Other Ambulatory Visit: Payer: Self-pay

## 2010-12-09 DIAGNOSIS — Z331 Pregnant state, incidental: Secondary | ICD-10-CM

## 2010-12-09 LAB — GLUCOSE, CAPILLARY: Glucose-Capillary: 95 mg/dL (ref 70–99)

## 2010-12-10 LAB — GLUCOSE TOLERANCE, 3 HOURS
Glucose Tolerance, 1 hour: 181 mg/dL (ref 70–189)
Glucose Tolerance, 2 hour: 116 mg/dL (ref 70–164)
Glucose Tolerance, Fasting: 83 mg/dL (ref 70–104)
Glucose, GTT - 3 Hour: 128 mg/dL (ref 70–144)

## 2010-12-19 LAB — CONVERTED CEMR LAB: Pap Smear: NEGATIVE

## 2010-12-20 ENCOUNTER — Ambulatory Visit (INDEPENDENT_AMBULATORY_CARE_PROVIDER_SITE_OTHER): Payer: Self-pay | Admitting: Family Medicine

## 2010-12-20 VITALS — BP 123/81 | Wt 146.0 lb

## 2010-12-20 DIAGNOSIS — Z3480 Encounter for supervision of other normal pregnancy, unspecified trimester: Secondary | ICD-10-CM

## 2010-12-20 DIAGNOSIS — Z3490 Encounter for supervision of normal pregnancy, unspecified, unspecified trimester: Secondary | ICD-10-CM

## 2010-12-20 NOTE — Patient Instructions (Addendum)
Fue agradable ver que [employer]. Me gustara que sigas en dos semanas. Me gustara que usted pueda recoger un paquete para el acceso del proyecto (de color naranja de la tarjeta) si hav no ya llegado a este. Esta tarjeta ayuda a pagar por los servicios en el [facility]. Recuerde que si usted tiene [place] fuga de lquido, sangrado vaginal, las contracciones, la disminucin del movimiento fetal o sentir las cosas simplemente no estn en lo cierto, por favor llame a nuestra oficina o ir a la MAU en [employer] de la [place].

## 2010-12-30 ENCOUNTER — Encounter: Payer: Self-pay | Admitting: Family Medicine

## 2010-12-30 DIAGNOSIS — Z3490 Encounter for supervision of normal pregnancy, unspecified, unspecified trimester: Secondary | ICD-10-CM

## 2010-12-30 HISTORY — DX: Encounter for supervision of normal pregnancy, unspecified, unspecified trimester: Z34.90

## 2010-12-30 NOTE — Progress Notes (Signed)
  Subjective:    Patient ID: April Taylor, female    DOB: [birth year], 25 y.o.   MRN: [id number]  HPI Here at 27.4 weeks.  No concerns today.  Has had good fetal movement.  Review of Systems Denies contractions, vaginal discharge, LOF, vaginal bleeding, headache, n/v, vision changes.      Objective:   Physical Exam  Constitutional: She appears well-developed and well-nourished.  Cardiovascular: Normal rate and regular rhythm.   Pulmonary/Chest: Effort normal and breath sounds normal.  Abdominal:       Gravid  Musculoskeletal: She exhibits no edema.

## 2011-01-03 ENCOUNTER — Ambulatory Visit (INDEPENDENT_AMBULATORY_CARE_PROVIDER_SITE_OTHER): Payer: Self-pay | Admitting: Family Medicine

## 2011-01-03 VITALS — BP 120/80 | Wt 144.0 lb

## 2011-01-03 DIAGNOSIS — Z3480 Encounter for supervision of other normal pregnancy, unspecified trimester: Secondary | ICD-10-CM

## 2011-01-03 DIAGNOSIS — Z3490 Encounter for supervision of normal pregnancy, unspecified, unspecified trimester: Secondary | ICD-10-CM

## 2011-01-03 NOTE — Assessment & Plan Note (Signed)
Patient doing well at 61 and 4/7 weeks. Measurements c/w dates Good fetal movement, no bleeding, ctx or lof   Kick count and pre-term labor precautions given. Plans to bring baby to [facility]  Knows she is having boy, does not plan to have circumcision  F/u in 2 weeks. Reasons to go to MAU discussed.

## 2011-01-03 NOTE — Patient Instructions (Addendum)
Recuerde que si usted tiene [place] perdida de lquido, sangrado vaginal, las contracciones, la disminucin del movimiento fetal o sentir las cosas simplemente no estn en lo cierto, por favor llame a nuestra oficina o ir a la MAU en [employer] de la [place].   Trabajo de parto prematuro,  cuidados en Advice worker (Preterm Labor, Home Care) Se denomina parto prematuro a las contracciones uterinas que causan la apertura (dilatacin), acortamiento y afinamiento del cuello del tero, antes de las 37 semanas de [place]. Es la mayor causa de ingreso de mujeres embarazadas al hospital. [place] causas del [place] de [place] prematuro son:  Katha Hamming de los casos se desencadena por motivos desconocidos.   Pequeas zonas de separacin de la placenta (abrupcin).   Polihidramnios (lquido en exceso en el saco amnitico).   Embarazo de gemelos o ms bebs.   Cerviz incompetente (no puede contener al beb debido a que el tejido es demasiado dbil).   Cambios hormonales.   Hemorragia vaginal en ms de uno de los trimestres.   Infeccin en el crvix, la vagin o la vejiga   El consumo de cigarrillos.   Sindrome antifosfolipdico Se produce cuando los anticuerpos afectan las protenas del organismo.  DIAGNSTICO Los factores que ayudan a predecir el trabajo de parto prematuro son:  Historias de Proofreader con trabajo de parto prematuro.   Vaginosis bacteriana en las mujeres que tuvieron trabajo de [place].   Monitoreo de la actividad uterina que demuestre contracciones uterinas.   La protena fibronectina fetal est elevada en mujeres con historia previa del trastorno.   La medicin del largo del crvix con tcnicas de ultrasonido muestra signos de acortamiento antes de la fecha [place].   Realizando una evaluacin conjunta con fibronectina y ecografa cervical, se puede predecir mejor un parto prematuro inminente.   Otros factores de riesgo son:   No pertenecer a Armed forces training and education officer.   Embarazo a los 17 aos o [address]   Embarazo a los 35 aos o ms.   Nivel socioeconmico bajo.   Aumento de peso deficiente durante el [place].  PREVENCIN No todos los partos prematuros pueden prevenirse. Algunas contracciones prematuras pueden prevenirse con medidas simples.  Mantngase bien hidratada. Beba 8 vasos de Warehouse manager. Esto disminuye la posibilidad del parto prematuro. El porcentaje de partos prematuros aumenta en los meses de verano. La deshidratacin hace que el volumen de sangre disminuya. Esto aumenta la concentracin de oxitocina (la hormona que produce las contracciones uterinas) en la [place]. La hidratacin ayuda a prevenir este incremento.   Observe si se presentan signos de infeccin. Estos signos incluyen la sensacin de ardor o el aumento de la necesidad de Geographical information systems officer, secrecin vaginal anormal o el aumento de temperatura sin causa aparente. Esto puedo Engineer, drilling.   Concurra puntualmente a las citas con el profesional que la asiste. Comunquese inmediatamente con el mdico si siente contracciones uterinas.   Busque asesoramiento mdico si tiene preguntas o surge algn problema. Es mejor hacerle las preguntas al profesional que tener un trabajo de parto prematuro sin TEFL teacher.  MANEJO DEL PARTO [employer], DENTRO Y [facility] Hay varios cosas para controlar durante el parto prematuro. Entre ellas se incluyen tanto las medidas mdicas como el cuidado personal para usted y su beb. Generalmente este problema se trata en el hospital. Algunas medidas pueden ser de gran ayuda en el parto prematuro:  Hidratacin (oral o intravenosa) Beba 8 vasos de Warehouse manager.   Haga reposo  en cama (en su casa o en el hospital) Puede resultarle de gran ayuda recostarse sobre el lado izquierdo.   Evite las [employer] y los orgasmos.   Algunos medicamentos (antibiticos) la ayudarn en la prevencin de infecciones. Corre ms riesgos si ha  roto la bolsa o si las contracciones estn causadas por una infeccin [employer] medicamentos como se le indic.   Evaluacin del beb. Estas pruebas ayudarn al profesional a saber si el beb se encuentra en buen estado y lo que Media planner en caso que se produzca el nacimiento de [place] anticipada. Entre ella se incluyen:   El perfil biofsico   Pruebas de estrs y no estrs.   Amniocentesis, para evaluar la madurez pulmonar del feto.   ndice del volumen del lquido amnitico.   Prueba de [place].   Hay medicamentos que ayudan a que los pulmones del beb se desarrollen ms rpidamente. Esto puede suceder si el parto prematuro no puede detenerse.   El profesional podr darle otros consejos acerca de la preparacin en caso de un nacimiento prematuro. Puede ser de ayuda si es necesario administrar corticoides para ayudar a la maduracin de los pulmones del beb.   Los tocolticos (frmacos que ayudan a TEFL teacher las contracciones uterinas) pueden ayudar a Veterinary surgeon [address]  [address].   En algunos casos es beneficiosa la administracin de progesterona.  TRATAMIENTO El mejor tratamiento es la prevencin, Solicitor los factores de riesgo y la deteccin temprana. Asegrese de Science writer con el profesional cules son los signos y los sntomas (qu puede ocurrir) del Sport and exercise psychologist, especialmente si ha sufrido un trabajo de parto prematuro en un embarazo previo. INSTRUCCIONES PARA EL CUIDADO DOMICILIARIO:  Consuma una dieta balanceada y nutritiva.   Tome las vitaminas como se le indic.   Beba entre 6 y 8 vasos de lquidos por Futures trader.   Descanse y duerma lo suficiente.   No tenga relaciones sexuales si tiene un trabajo de parto prematuro o tiene alto riesgo de tenerlo.   Siga las recomendaciones de su mdico con respecto a las actividades, [address], anlisis de [place] y otros exmenes (ecografas, amniocentesis).   Evite el estrs.   Evite los trabajos extenuantes o la  actividad fsica prolongada si tiene riesgo elevado de parto prematuro.   No fume.  SOLICITE ATENCIN MDICA DE INMEDIATO SI:  Tiene contracciones.   Siente dolor abdominal.   Presenta una hemorragia vaginal abundante.   Siente dolor al [employer].   Observa una hemorragia vaginal anormal.   La temperatura se eleva por encima de 102 F (38,9 C).

## 2011-01-03 NOTE — Progress Notes (Signed)
Subjective:    April Taylor is a 25 y.o. female being seen today for her obstetrical visit. She is at [redacted]w[redacted]d gestation. Patient reports no bleeding, no contractions, no cramping and no leaking. Fetal movement: normal.  Objective:    BP 120/80  Wt 144 lb (65.318 kg)  LMP 05/13/2010  Physical Exam  Maternal Exam:  Abdomen: not evaluated.  Introitus: not evaluated.   Cervix: not evaluated.   Fetal Exam Fetal Monitor Review: Mode: hand-held doppler probe.   Baseline rate: 142.       FHT:  142 BPM  Uterine Size: size equals dates  Presentation: Not done.     Assessment:    Pregnancy:  G2P1001    Plan:    Patient Active Problem List  Diagnoses  . VAGINITIS  . ABNORMAL MATERNAL GLUCOSE TOLERANCE ANTEPARTUM  . Supervision of normal pregnancy    Pediatrician: discussed. Infant feeding: plans to breastfeed, plans to bottle feed. Cigarette smoking: never smoked. Follow up in 2 Weeks.

## 2011-01-20 ENCOUNTER — Ambulatory Visit (INDEPENDENT_AMBULATORY_CARE_PROVIDER_SITE_OTHER): Payer: Self-pay | Admitting: Family Medicine

## 2011-01-20 VITALS — BP 134/86 | Wt 148.7 lb

## 2011-01-20 DIAGNOSIS — Z3480 Encounter for supervision of other normal pregnancy, unspecified trimester: Secondary | ICD-10-CM

## 2011-01-20 DIAGNOSIS — Z331 Pregnant state, incidental: Secondary | ICD-10-CM

## 2011-01-20 DIAGNOSIS — Z3490 Encounter for supervision of normal pregnancy, unspecified, unspecified trimester: Secondary | ICD-10-CM

## 2011-01-20 LAB — STREP B DNA PROBE: Strep Group B Ag: NEGATIVE

## 2011-01-20 NOTE — Progress Notes (Signed)
  Subjective:    Patient ID: April Taylor, female    DOB: April 09, 1986, 25 y.o.   MRN: [id number]  HPI Doing well at 36 0/7 weeks, occasional low abdominal pains that feel like contractions, nothing strong or regular.  Good fetal movement.  Denies LOF, vaginal bleeding   Review of Systems     Objective:   Physical Exam  Constitutional: She appears well-developed and well-nourished.  Cardiovascular: Regular rhythm and normal heart sounds.   Pulmonary/Chest: Effort normal and breath sounds normal. No respiratory distress.  Musculoskeletal:       Trace edema           Assessment & Plan:    Pregnant state, incidental -Patient doing well at 36 and 0/7 weeks. -Measurements c/w dates -Good fetal movement & occasional ctx, no bleeding or lof   -Kick count and pre-term labor precautions given. -Plans to bring baby to [facility], breast/formula feeding -Knows she is having boy, does not plan to have circumcision -Strep B DNA probe -GC/chlamydia probe amp, genital -F/u in 1 weeks. -Reasons to go to MAU discussed.

## 2011-01-20 NOTE — Patient Instructions (Signed)
Su fecha de vencimiento se acerca, usted est haciendo muy bien April Taylor. Hoy estamos de volver a Scientist, physiological gonorrea, la clamidia, y la comprobacin de estreptococo del grupo B. Usted puede comenzar a sentir Engineer, production su cuerpo se prepara para el parto. Si las contracciones se vuelven regulares y fuertes, que haya fugas de lquido de su vagina, tiene sangrado vaginal o no se siente a su beb moverse en favor normales ir a la MAU en [employer] de la [place]. Si tiene alguna pregunta por favor llame a nuestra oficina. Trabajo de parto prematuro,   cuidados en Advice worker (Preterm Labor, Home Care) Se denomina parto prematuro a las contracciones uterinas que causan la apertura (dilatacin), acortamiento y afinamiento del cuello del tero, antes de las 37 semanas de [place]. Es la mayor causa de ingreso de mujeres embarazadas al hospital. Cassandra causas del [place] prematuro son:  Katha Hamming de los casos se desencadena por motivos desconocidos.   Pequeas zonas de separacin de la placenta (abrupcin).   Polihidramnios (lquido en exceso en el saco amnitico).   Embarazo de gemelos o ms bebs.   Cerviz incompetente (no puede contener al beb debido a que el tejido es demasiado dbil).   Cambios hormonales.   Hemorragia vaginal en ms de uno de los trimestres.   Infeccin en el crvix, la vagin o la vejiga   El consumo de cigarrillos.   Sindrome antifosfolipdico Se produce cuando los anticuerpos afectan las protenas del organismo.  DIAGNSTICO Los factores que ayudan a predecir el trabajo de parto prematuro son:  Historias de Proofreader con trabajo de parto prematuro.   Vaginosis bacteriana en las mujeres que tuvieron trabajo de [place].   Monitoreo de la actividad uterina que demuestre contracciones uterinas.   La protena fibronectina fetal est elevada en mujeres con historia previa del trastorno.   La medicin del largo del  crvix con tcnicas de ultrasonido muestra signos de acortamiento antes de la fecha [place].   Realizando una evaluacin conjunta con fibronectina y ecografa cervical, se puede predecir mejor un parto prematuro inminente.   Otros factores de riesgo son:   No pertenecer a Biomedical engineer.   Embarazo a los 17 aos o [address]   Embarazo a los 35 aos o ms.   Nivel socioeconmico bajo.   Aumento de peso deficiente durante el [employer].  PREVENCIN No todos los partos prematuros pueden prevenirse. Algunas contracciones prematuras pueden prevenirse con medidas simples.  Mantngase bien hidratada. Beba 8 vasos de Warehouse manager. Esto disminuye la posibilidad del parto prematuro. El porcentaje de partos prematuros aumenta en los meses de verano. La deshidratacin hace que el volumen de sangre disminuya. Esto aumenta la concentracin de oxitocina (la hormona que produce las contracciones uterinas) en la [place]. La hidratacin ayuda a prevenir este incremento.   Observe si se presentan signos de infeccin. Estos signos incluyen la sensacin de ardor o el aumento de la necesidad de Geographical information systems officer, secrecin vaginal anormal o el aumento de temperatura sin causa aparente. Esto puedo Engineer, drilling.   Concurra puntualmente a las citas con el profesional que la asiste. Comunquese inmediatamente con el mdico si siente contracciones uterinas.   Busque asesoramiento mdico si tiene preguntas o surge algn problema. Es mejor hacerle las preguntas al profesional que tener un trabajo de parto prematuro sin TEFL teacher.  MANEJO DEL PARTO [employer], DENTR[facility] Y [facility] Hay varios cosas para controlar durante el parto prematuro. Entre ellas  se incluyen tanto las medidas mdicas como el cuidado personal para usted y su beb. Generalmente este problema se trata en el hospital. Algunas medidas pueden ser de gran ayuda en el parto prematuro:  Hidratacin (oral o intravenosa) Beba 8 vasos de Musician.   Haga reposo en cama (en su casa o en el hospital) Puede resultarle de gran ayuda recostarse sobre el lado izquierdo.   Evite las [employer] y los orgasmos.   Algunos medicamentos (antibiticos) la ayudarn en la prevencin de infecciones. Corre ms riesgos si ha roto la bolsa o si las contracciones estn causadas por una infeccin [employer] medicamentos como se le indic.   Evaluacin del beb. Estas pruebas ayudarn al profesional a saber si el beb se encuentra en buen estado y lo que Media planner en caso que se produzca el nacimiento de [place] anticipada. Entre ella se incluyen:   El perfil biofsico   Pruebas de estrs y no estrs.   Amniocentesis, para evaluar la madurez pulmonar del feto.   ndice del volumen del lquido amnitico.   Prueba de [place].   Hay medicamentos que ayudan a que los pulmones del beb se desarrollen ms rpidamente. Esto puede suceder si el parto prematuro no puede detenerse.   El profesional podr darle otros consejos acerca de la preparacin en caso de un nacimiento prematuro. Puede ser de ayuda si es necesario administrar corticoides para ayudar a la maduracin de los pulmones del beb.   Los tocolticos (frmacos que ayudan a TEFL teacher las contracciones uterinas) pueden ayudar a Veterinary surgeon [address]  [address].   En algunos casos es beneficiosa la administracin de progesterona.  TRATAMIENTO El mejor tratamiento es la prevencin, Solicitor los factores de riesgo y la deteccin temprana. Asegrese de Science writer con el profesional cules son los signos y los sntomas (qu puede ocurrir) del Sport and exercise psychologist, especialmente si ha sufrido un trabajo de parto prematuro en un embarazo previo. INSTRUCCIONES PARA EL CUIDADO DOMICILIARIO:  Consuma una dieta balanceada y nutritiva.   Tome las vitaminas como se le indic.   Beba entre 6 y 8 vasos de lquidos por Futures trader.   Descanse y duerma lo suficiente.   No tenga relaciones sexuales si tiene  un trabajo de parto prematuro o tiene alto riesgo de tenerlo.   Siga las recomendaciones de su mdico con respecto a las actividades, [address], anlisis de [place] y otros exmenes (ecografas, amniocentesis).   Evite el estrs.   Evite los trabajos extenuantes o la actividad fsica prolongada si tiene riesgo elevado de parto prematuro.   No fume.  SOLICITE ATENCIN MDICA DE INMEDIATO SI:  Tiene contracciones.   Siente dolor abdominal.   Presenta una hemorragia vaginal abundante.   Siente dolor al [employer].   Observa una hemorragia vaginal anormal.   La temperatura se eleva por encima de 102 F (38,9 C).  Document Released: 07/16/2005 Document Re-Released: 08/03/2009 [facility] Patient Information 2011 [place], [place].

## 2011-01-20 NOTE — Assessment & Plan Note (Addendum)
Patient doing well at 13 and 0/7 weeks. Measurements c/w dates Good fetal movement & occasional ctx, no bleeding or lof   Kick count and pre-term labor precautions given. Plans to bring baby to [facility], breast/formula feeding Knows she is having boy, does not plan to have circumcision  F/u in 1 weeks. Reasons to go to MAU discussed.

## 2011-01-21 LAB — POCT I-STAT, CHEM 8
Creatinine, Ser: 0.5 mg/dL (ref 0.4–1.2)
HCT: 46 % (ref 36.0–46.0)
Hemoglobin: 15.6 g/dL — ABNORMAL HIGH (ref 12.0–15.0)
Sodium: 139 mEq/L (ref 135–145)
TCO2: 26 mmol/L (ref 0–100)

## 2011-01-21 LAB — GC/CHLAMYDIA PROBE AMP, GENITAL: Chlamydia, DNA Probe: NEGATIVE

## 2011-01-21 LAB — POCT RAPID STREP A (OFFICE): Streptococcus, Group A Screen (Direct): NEGATIVE

## 2011-01-22 LAB — POCT RAPID STREP A (OFFICE): Streptococcus, Group A Screen (Direct): NEGATIVE

## 2011-01-27 ENCOUNTER — Ambulatory Visit (INDEPENDENT_AMBULATORY_CARE_PROVIDER_SITE_OTHER): Payer: Self-pay | Admitting: Family Medicine

## 2011-01-27 VITALS — BP 124/80 | Temp 98.4°F | Wt 150.6 lb

## 2011-01-27 DIAGNOSIS — Z3480 Encounter for supervision of other normal pregnancy, unspecified trimester: Secondary | ICD-10-CM

## 2011-01-27 DIAGNOSIS — Z3490 Encounter for supervision of normal pregnancy, unspecified, unspecified trimester: Secondary | ICD-10-CM

## 2011-01-27 NOTE — Assessment & Plan Note (Signed)
Patient doing well at 89 and 0/7 weeks. Measurements c/w dates Good fetal movement & occasional ctx, no bleeding or lof   Plans to bring baby to [facility], breast/formula feeding Knows she is having boy, does not plan to have circumcision GBS Neg Rh Pos F/u in 1 week Reasons to go to MAU and labor precautions discussed.

## 2011-01-27 NOTE — Progress Notes (Signed)
Here at 37 0/7 weeks, feeling baby move well.  Denies frequent contractions, LOF, or vaginal bleeding.  Mild edema in lower legs.  Denies nausea, reflux, back pain at this time.  Excited for baby to be here soon   A&P Patient doing well at 37 and 0/7 weeks. Measurements c/w dates Good fetal movement & occasional ctx, no bleeding or lof   Plans to bring baby to [facility], breast/formula feeding Knows she is having boy, does not plan to have circumcision GBS Neg Rh Pos F/u in 1 week Reasons to go to MAU and labor precautions discussed.

## 2011-02-03 ENCOUNTER — Ambulatory Visit (INDEPENDENT_AMBULATORY_CARE_PROVIDER_SITE_OTHER): Payer: Self-pay | Admitting: Family Medicine

## 2011-02-03 ENCOUNTER — Telehealth: Payer: Self-pay | Admitting: Family Medicine

## 2011-02-03 VITALS — BP 136/78 | Temp 98.0°F | Wt 152.2 lb

## 2011-02-03 DIAGNOSIS — Z3490 Encounter for supervision of normal pregnancy, unspecified, unspecified trimester: Secondary | ICD-10-CM

## 2011-02-03 DIAGNOSIS — Z3480 Encounter for supervision of other normal pregnancy, unspecified trimester: Secondary | ICD-10-CM

## 2011-02-03 NOTE — Patient Instructions (Signed)
Fue bueno ver a ustedes hoy. Usted puede tener al beb en cualquier momento. Si usted tiene English as a second language teacher, prdida de lquido o sangrado ir al Hospital de la [place]. Se har un seguimiento conmigo en una semana una vez ms aqu en la clnica si usted no tiene beb antes de esa fecha.

## 2011-02-03 NOTE — Telephone Encounter (Signed)
Opened in error

## 2011-02-05 NOTE — Progress Notes (Signed)
Here at 38 0/7 weeks, feeling baby move well.  Denies frequent contractions, LOF, or vaginal bleeding.  Mild edema in lower legs.  Denies nausea, reflux, back pain at this time.  Excited for baby to be here soon   A&P Patient doing well at 28 and 0/7 weeks. Measurements c/w dates Good fetal movement & occasional ctx, no bleeding or lof   Plans to bring baby to [facility], breast/formula feeding Knows she is having boy, does not plan to have circumcision GBS Neg Rh Pos F/u in 1 week Reasons to go to MAU and labor precautions discussed.

## 2011-02-05 NOTE — Assessment & Plan Note (Signed)
Patient doing well at 43 and 0/7 weeks. Measurements c/w dates Good fetal movement & occasional ctx, no bleeding or lof   Plans to bring baby to [facility], breast/formula feeding Knows she is having boy, does not plan to have circumcision GBS Neg Rh Pos F/u in 1 week Reasons to go to MAU and labor precautions discussed.

## 2011-02-07 ENCOUNTER — Inpatient Hospital Stay (HOSPITAL_COMMUNITY)
Admission: AD | Admit: 2011-02-07 | Discharge: 2011-02-10 | DRG: 775 | Disposition: A | Discharge status: Home / Self Care | Payer: Medicaid Other | Source: Ambulatory Visit | Attending: Obstetrics and Gynecology | Admitting: Obstetrics and Gynecology

## 2011-02-07 DIAGNOSIS — O4290 Premature rupture of membranes, unspecified as to length of time between rupture and onset of labor, unspecified weeks of gestation: Principal | ICD-10-CM | POA: Diagnosis present

## 2011-02-07 LAB — URINALYSIS, ROUTINE W REFLEX MICROSCOPIC
Glucose, UA: NEGATIVE mg/dL
Leukocytes, UA: NEGATIVE
Nitrite: NEGATIVE
Protein, ur: NEGATIVE mg/dL
pH: 7 (ref 5.0–8.0)

## 2011-02-07 LAB — URINE MICROSCOPIC-ADD ON

## 2011-02-08 DIAGNOSIS — O4290 Premature rupture of membranes, unspecified as to length of time between rupture and onset of labor, unspecified weeks of gestation: Secondary | ICD-10-CM

## 2011-02-08 LAB — COMPREHENSIVE METABOLIC PANEL
ALT: 10 U/L (ref 0–35)
AST: 24 U/L (ref 0–37)
Alkaline Phosphatase: 116 U/L (ref 39–117)
CO2: 17 mEq/L — ABNORMAL LOW (ref 19–32)
Calcium: 9.3 mg/dL (ref 8.4–10.5)
Chloride: 109 mEq/L (ref 96–112)
GFR calc non Af Amer: >60 mL/min (ref >60)
Glucose, Bld: 88 mg/dL (ref 70–99)
Potassium: 4 mEq/L (ref 3.5–5.1)
Sodium: 138 mEq/L (ref 135–145)
Total Bilirubin: 0.2 mg/dL — ABNORMAL LOW (ref 0.3–1.2)

## 2011-02-08 LAB — CREATININE, URINE, RANDOM: Creatinine, Urine: 59.7 mg/dL

## 2011-02-08 LAB — CBC
HCT: 36.3 % (ref 36.0–46.0)
MCH: 27.6 pg (ref 26.0–34.0)
MCHC: 33.3 g/dL (ref 30.0–36.0)
RDW: 13.9 % (ref 11.5–15.5)

## 2011-02-08 LAB — PROTEIN / CREATININE RATIO, URINE: Creatinine, Urine: 51.8 mg/dL

## 2011-02-10 ENCOUNTER — Encounter: Payer: Self-pay | Admitting: Family Medicine

## 2011-03-25 ENCOUNTER — Ambulatory Visit (INDEPENDENT_AMBULATORY_CARE_PROVIDER_SITE_OTHER): Payer: Self-pay | Admitting: Family Medicine

## 2011-03-25 VITALS — BP 109/76 | HR 102 | Ht 61.0 in | Wt 133.0 lb

## 2011-03-25 DIAGNOSIS — Z3490 Encounter for supervision of normal pregnancy, unspecified, unspecified trimester: Secondary | ICD-10-CM

## 2011-04-02 NOTE — Progress Notes (Signed)
  Subjective:    Patient ID: April Taylor, female    DOB: 1986-07-10, 25 y.o.   MRN: [id number]  HPI Here for 6 week PP follow up appt.  Doing well.  Baby is doing well.  Mom is continuing to breast feed, doing well with this.  No pain with breast feeding.  Wanting IUD for contraception has qualified for scholarship for free mirena, although has not arrived at our office at this time.  Reports mood is good.  Denies vaginal bleeding or discharge.  No other concerns today   Review of Systems    Denies nausea, vomiting, diarrhea, chest pain, sob, dizziness. Objective:   Physical Exam  Constitutional: She is oriented to person, place, and time. She appears well-developed and well-nourished. No distress.  HENT:  Head: Normocephalic and atraumatic.  Neck: Neck supple. No thyromegaly present.  Cardiovascular: Normal rate, regular rhythm and normal heart sounds.   Pulmonary/Chest: Effort normal and breath sounds normal.  Abdominal: Soft. Bowel sounds are normal. There is no tenderness.  Lymphadenopathy:    She has no cervical adenopathy.  Neurological: She is alert and oriented to person, place, and time.  Skin: Skin is warm and dry.  Psychiatric: She has a normal mood and affect. Her behavior is normal. Judgment and thought content normal.          Assessment & Plan:

## 2011-04-03 ENCOUNTER — Ambulatory Visit: Payer: Medicaid Other | Admitting: Family Medicine

## 2011-04-07 NOTE — Assessment & Plan Note (Signed)
Here for 6 week post partum follow up, GU exam deferred until when Mirena comes in to be inserted.  Will have her schedule in  2 weeks for mirena insertion.  Otherwise doing well, no signs of PP depression, mood congruent.  Doing well with breast feeding.

## 2011-04-10 ENCOUNTER — Ambulatory Visit (INDEPENDENT_AMBULATORY_CARE_PROVIDER_SITE_OTHER): Payer: Self-pay | Admitting: Family Medicine

## 2011-04-10 DIAGNOSIS — Z309 Encounter for contraceptive management, unspecified: Secondary | ICD-10-CM

## 2011-04-10 MED ORDER — LEVONORGESTREL 20 MCG/24HR IU IUD
INTRAUTERINE_SYSTEM | Freq: Once | INTRAUTERINE | Status: AC
  Administered 2011-04-10: 1 via INTRAUTERINE

## 2011-04-15 NOTE — Progress Notes (Deleted)
  Subjective:    Patient ID: April Taylor, female    DOB: 07-Apr-1986, 25 y.o.   MRN: [id number]  HPI    Review of Systems     Objective:   Physical Exam        Assessment & Plan:

## 2011-04-15 NOTE — Progress Notes (Signed)
Patient here for Mirena insertion.  Patient informed of risks/benefits of Mirena inserstion and use.  Consent signed, negative urine pregnancy test confirmed and time out performed prior to insertion.  Mirena inserted in typical fashion with no complications and minimal bleeding.  Patient instructed on how to check strings and when to return for removal.

## 2011-06-09 ENCOUNTER — Ambulatory Visit (INDEPENDENT_AMBULATORY_CARE_PROVIDER_SITE_OTHER): Payer: Self-pay | Admitting: Family Medicine

## 2011-06-09 ENCOUNTER — Encounter: Payer: Self-pay | Admitting: Family Medicine

## 2011-06-09 VITALS — BP 123/77 | HR 114 | Wt 122.0 lb

## 2011-06-09 DIAGNOSIS — IMO0001 Reserved for inherently not codable concepts without codable children: Secondary | ICD-10-CM

## 2011-06-09 DIAGNOSIS — L409 Psoriasis, unspecified: Secondary | ICD-10-CM

## 2011-06-09 DIAGNOSIS — L408 Other psoriasis: Secondary | ICD-10-CM

## 2011-06-09 MED ORDER — FLUOCINONIDE 0.05 % EX CREA: TOPICAL_CREAM | Freq: Two times a day (BID) | CUTANEOUS | Status: DC

## 2011-06-09 MED ORDER — LORATADINE 10 MG PO CAPS: 10.0000 mg | ORAL_CAPSULE | ORAL | Status: DC

## 2011-06-09 MED ORDER — FLUOCINONIDE 0.1 % EX CREA: 1.0000 "application " | TOPICAL_CREAM | Freq: Two times a day (BID) | CUTANEOUS | Status: DC

## 2011-06-09 MED ORDER — FLUOCINONIDE 0.05 % EX SOLN: Freq: Two times a day (BID) | CUTANEOUS | Status: DC

## 2011-06-09 MED ORDER — PYRITHIONE ZINC 1 % EX SHAM: MEDICATED_SHAMPOO | Freq: Every day | CUTANEOUS | Status: DC | PRN

## 2011-06-09 MED ORDER — FLUOCINONIDE-E 0.05 % EX CREA: TOPICAL_CREAM | Freq: Two times a day (BID) | CUTANEOUS | Status: DC

## 2011-06-09 NOTE — Progress Notes (Signed)
  Subjective:    Patient ID: April Taylor, female    DOB: 1986-03-04, 25 y.o.   MRN: [id number]  HPIOne year of posterior scalp rash and itching. Was told that it is psoriasis, but doesn't have in other parts of the body, but is increasing in size.  The fluocinonide lotion was effective but it cost her over $60 for 3 bottles. She had tried a coali tar shampoo but did not find it helpful. The itching keeps her awake. No rash elsewhere on her body and no family members have psoriasis.  Otherwise she is well, continues to breast-feed her baby  Interview conducted in Spanish  Review of Systems     Objective:   Physical Exam  Skin:       5x6 cm patch of scaling skin typical of psoriasis  No lesions on her elbows. No sign of infection.        Assessment & Plan:

## 2011-06-09 NOTE — Patient Instructions (Signed)
Regrese en dos meses para chequeo del salpullido

## 2011-06-09 NOTE — Assessment & Plan Note (Signed)
Typical psoriasis patch. Medication is too expensive for since she has no insurance. The clerk at [facility] recommended the fluocinonide cream 0.05% as least expensive at $8 for 60 g tube. She will use this present time but did also get the lotion if necessary to keep her hair nice. She can take loratadine at bedtime for itching. She's to try generic she had shoulder shampoo to decrease scaling.

## 2011-07-22 ENCOUNTER — Telehealth: Payer: Self-pay | Admitting: *Deleted

## 2011-07-22 MED ORDER — FLUOCINONIDE 0.05 % EX CREA: TOPICAL_CREAM | Freq: Two times a day (BID) | CUTANEOUS | Status: DC

## 2011-07-22 NOTE — Telephone Encounter (Signed)
Patient calls requesting refill on Lidex cream.  Does not want the solution because this is too expensive. [facility] [address] Will forward to Dr. Ashley Royalty.

## 2011-07-25 NOTE — Telephone Encounter (Signed)
Patient came by office . She had been by pharmacy and lidex cream RX was not there. I called pharmacy and they do have RX. Patient notified to go back to pharmacy.

## 2011-08-05 ENCOUNTER — Encounter (HOSPITAL_COMMUNITY): Payer: Self-pay | Admitting: *Deleted

## 2011-08-09 ENCOUNTER — Inpatient Hospital Stay (INDEPENDENT_AMBULATORY_CARE_PROVIDER_SITE_OTHER)
Admission: RE | Admit: 2011-08-09 | Discharge: 2011-08-09 | Disposition: A | Discharge status: Home / Self Care | Payer: Self-pay | Source: Ambulatory Visit | Attending: Family Medicine | Admitting: Family Medicine

## 2011-08-09 DIAGNOSIS — R070 Pain in throat: Secondary | ICD-10-CM

## 2011-09-01 ENCOUNTER — Encounter: Payer: Self-pay | Admitting: Family Medicine

## 2011-09-01 ENCOUNTER — Ambulatory Visit (INDEPENDENT_AMBULATORY_CARE_PROVIDER_SITE_OTHER): Payer: Self-pay | Admitting: Family Medicine

## 2011-09-01 VITALS — BP 112/68 | HR 91 | Ht 62.0 in | Wt 125.0 lb

## 2011-09-01 DIAGNOSIS — L408 Other psoriasis: Secondary | ICD-10-CM

## 2011-09-01 DIAGNOSIS — L409 Psoriasis, unspecified: Secondary | ICD-10-CM

## 2011-09-01 MED ORDER — FLUOCINONIDE 0.05 % EX CREA: TOPICAL_CREAM | Freq: Two times a day (BID) | CUTANEOUS | Status: DC

## 2011-09-01 NOTE — Progress Notes (Signed)
  Subjective:    Patient ID: April Taylor, female    DOB: September 27, 1986, 25 y.o.   MRN: [id number]  HPI Patient seen in Hispanic clinic, visit conducted in [place].  The patient comes in today for followup of her scalp psoriasis. She reports that the Lidex cream has been very helpful in controlling her eruptions in her scalp. After 5 days of use, eruptions go away. She can go about one week without using the cream before it erupts began. She has had no further eruptions elsewhere on her scalp or on any other areas of her skin. She denies any joint involvement or redness or swelling of her knuckles.  She had used T-Gel shampoo about 2 years ago for a few months without improvement. She had used head and shoulders but found that she had more dandruff using it than without. She is currently using pantene shampoo daily.  Review of Systems See history of present illness    Objective:   Physical Exam Well-appearing in no acute distress. HEENT: She has a large 6 x 8 cm area of flaky psoriatic plaque on the right occipital area of her scalp. No postauricular or nuchal involvement. No alopecia. Skin: Review of skin and elbows nape of neck, or back did not reveal any additional lesions.       Assessment & Plan:

## 2011-09-01 NOTE — Assessment & Plan Note (Signed)
Patient with scalp psoriasis which is well controlled when she uses Lidex but does not tolerate without it. I discussed with her the adverse effects of long-term topical steroid use. She was not pleased with the T-Gel that she used 2 years ago. She agrees to a trial of a new car containing shampoo, namely DHS tar shampoo. She will use the Lidex cream twice daily for the next 5-10 days to control her symptoms, and then begin using the tar shampoo for maintenance. She agrees to contact us if this is not appropriately controlling her psoriasis symptoms.

## 2011-09-01 NOTE — Patient Instructions (Signed)
Fue un placer verle de nuevo hoy.   Mande' otra receta para la crema esteroid (fluocinonide), para usarla 2 veces por dia, por un maximo de 10 dias seguidos.  Cuando se le mejore, quiero que use un champu que se llama DHS Tar Shampoo, en vez del Pantene.  Creo que [employer] va a ayudar a Chief Operating Officer los sintomas de psoriasis, mejor.

## 2012-01-19 ENCOUNTER — Ambulatory Visit (INDEPENDENT_AMBULATORY_CARE_PROVIDER_SITE_OTHER): Payer: Self-pay | Admitting: Family Medicine

## 2012-01-19 ENCOUNTER — Encounter: Payer: Self-pay | Admitting: Family Medicine

## 2012-01-19 VITALS — BP 112/77 | HR 89 | Wt 119.8 lb

## 2012-01-19 DIAGNOSIS — N921 Excessive and frequent menstruation with irregular cycle: Secondary | ICD-10-CM | POA: Insufficient documentation

## 2012-01-19 DIAGNOSIS — Z30432 Encounter for removal of intrauterine contraceptive device: Secondary | ICD-10-CM

## 2012-01-19 DIAGNOSIS — N920 Excessive and frequent menstruation with regular cycle: Secondary | ICD-10-CM

## 2012-01-19 MED ORDER — NORETHINDRONE 0.35 MG PO TABS: 1.0000 | ORAL_TABLET | Freq: Every day | ORAL | Status: DC

## 2012-01-19 MED ORDER — NORGESTIM-ETH ESTRAD TRIPHASIC 0.18/0.215/0.25 MG-35 MCG PO TABS: 1.0000 | ORAL_TABLET | Freq: Every day | ORAL | Status: DC

## 2012-01-19 NOTE — Progress Notes (Signed)
  Subjective:    Patient ID: April Taylor, female    DOB: [birth year], 26 y.o.   MRN: [id number]  HPI 1. Bleeding with IUD Patient has had IUD for 8 months. She has had heavy bleeding approx. Twice per month. She also has dyspareunia, which she attributes to the IUD. She had a normal menses once a month that was not heavy prior to the IUD. She had no pain with sex prior to the IUD. No discharge, no abdominal pain, no fever. She is sexually active. She has no dysuria/frequency/hematuria.  Patient is still breast feeding.  Tobacco use: Patient is a non-smoker.   Review of Systems Pertinent items are noted in HPI.     Objective:   Physical Exam Filed Vitals:   01/19/12 1017  BP: 112/77  Pulse: 89  Weight: 119 lb 12.8 oz (54.341 kg)  Abdomen: soft and non-tender without masses, organomegaly or hernias noted.  No guarding or rebound Female genitalia: not done normal external genitalia, vulva, vagina, cervix, uterus and adnexa bleeding present from menses IUD string visualized, T device visualized on removal.    Assessment & Plan:

## 2012-01-19 NOTE — Progress Notes (Signed)
Procedure Note: IUD removal  Informed Consent Obtained for IUD removal. All Risks, benefits, alternative, complications reviewed with patient and understood. Time Out performed. Using no local anesthesia. Using a ringed forceps and speculum the string was visualized. The IUD was removed. No pain.  The patient tolerated the procedure well. No complications occurred. Patient counseled on post-operative care.

## 2012-01-19 NOTE — Assessment & Plan Note (Signed)
Heavy and too frequent for patient. Occurred since IUD inserted. She is still breast feeding.  I have prescribed Micronor while she breast feeds and sprintec when she stops breast feeding.

## 2012-01-19 NOTE — Patient Instructions (Signed)
It was great to see you today!  Schedule an appointment to see your PCP as needed.  You can start taking the Birth Control Pills now.

## 2012-03-22 ENCOUNTER — Encounter: Payer: Self-pay | Admitting: Family Medicine

## 2012-03-22 ENCOUNTER — Ambulatory Visit (INDEPENDENT_AMBULATORY_CARE_PROVIDER_SITE_OTHER): Payer: Self-pay | Admitting: Family Medicine

## 2012-03-22 VITALS — BP 107/72 | HR 84 | Ht 61.5 in | Wt 118.0 lb

## 2012-03-22 DIAGNOSIS — L408 Other psoriasis: Secondary | ICD-10-CM

## 2012-03-22 DIAGNOSIS — L409 Psoriasis, unspecified: Secondary | ICD-10-CM

## 2012-03-22 MED ORDER — FLUOCINONIDE 0.05 % EX SOLN: Freq: Two times a day (BID) | CUTANEOUS | Status: DC

## 2012-03-22 NOTE — Patient Instructions (Signed)
Fue muy agradable conocerle Por favor, vaya a la oficina de [facility] (en el departamento de salud) y tratar de ponerse en marcha con ellas Por favor, vuelve si esto no ayuda, o si tiene problemas.

## 2012-03-22 NOTE — Progress Notes (Signed)
  Subjective:    Patient ID: April Taylor, female    DOB: [birth year], 26 y.o.   MRN: [id number]  HPI Pt presents for eval of scalp psoriasis.  She has been using the tar shampoo and the lidex solution but doesn't think that the tar shampoo is helping her.  She would like to continue the lidex.  She has been out of it for 1 week.  Her rash is back again off of the medicine.  It is pruritic but not painful.  She does not have any rash anywhere else.     Review of Systems No fevers, HA, CP or SOB    Objective:   Physical Exam Vital signs reviewed General appearance - alert, well appearing, and in no distress Skin- 3 areas of scale on her scalp that are slightly swollen but not tender to palpation.       Assessment & Plan:

## 2012-03-22 NOTE — Assessment & Plan Note (Addendum)
Discussed with Dr. Mauricio Po and will continue the lidex solution.  Will not continue Tar shampoo at this time since ineffective.  No evidence of atrophy of skin from chronic steroids.  Plan to have pt get meds from [facility] if possible.

## 2012-06-09 IMAGING — US US OB COMP +14 WK
1 series · 14 of 28 positions shown · non-contrast
Comparison: none

OBSTETRICAL ULTRASOUND:
 This ultrasound exam was performed in the [HOSPITAL] Ultrasound Department.  The OB US report was generated in the AS system, and faxed to the ordering physician.  This report is also available in [HOSPITAL]?s Access[employer]ware and in [REDACTED] PACS.

[Series 1: us ob comp +14 wk · 0.21mm/px · 14 of 49 slices shown]
[im 2/49]
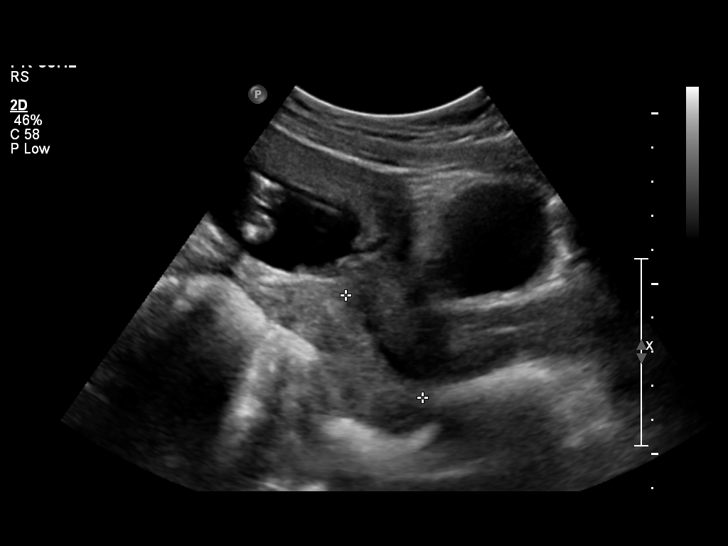
[im 6/49]
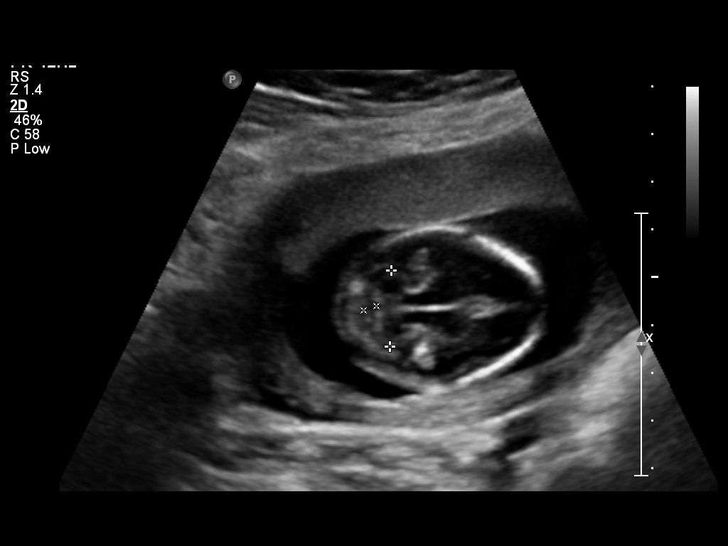
[im 9/49]
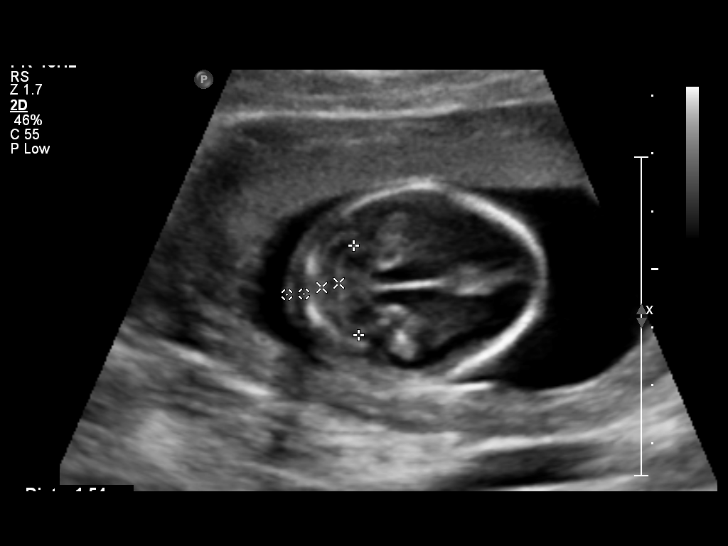
[im 13/49]
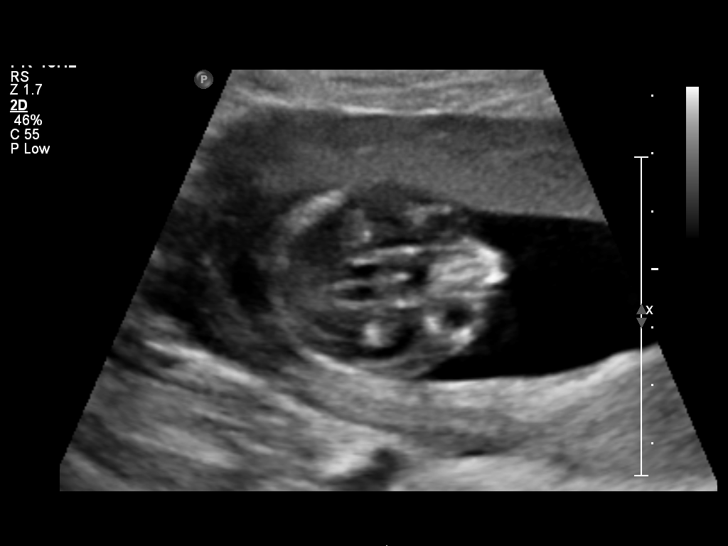
[im 17/49]
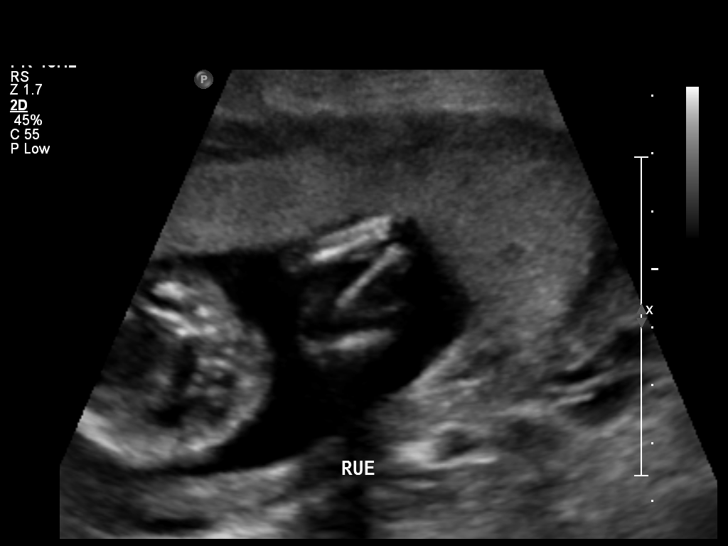
[im 20/49]
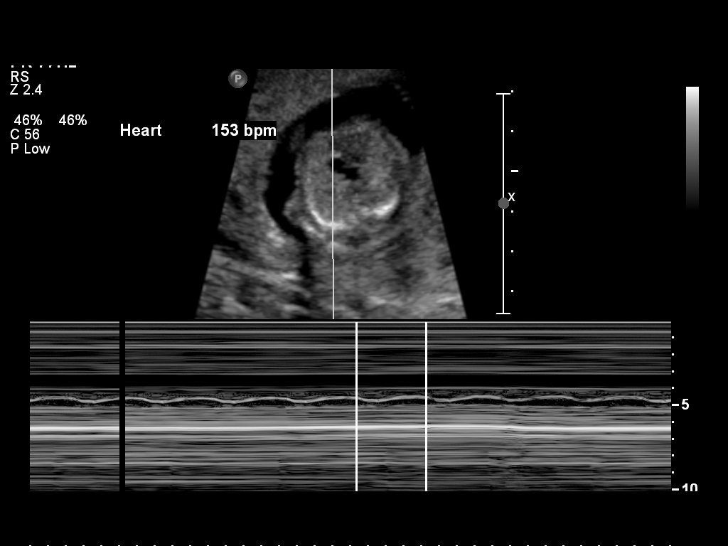
[im 24/49]
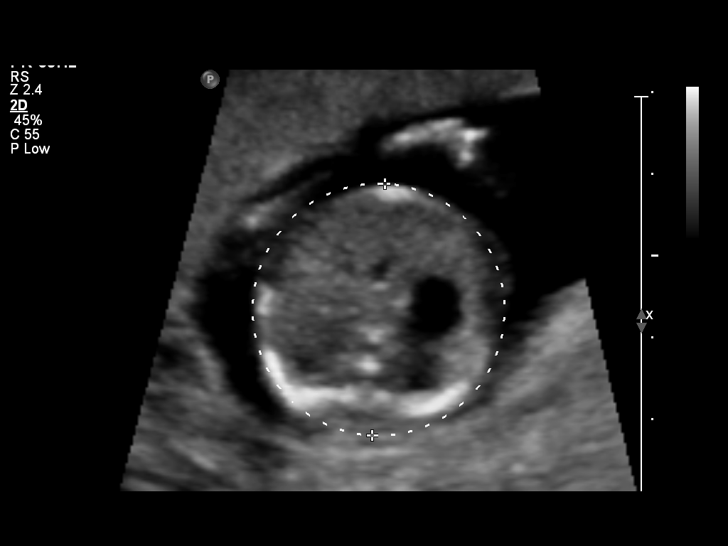
[im 27/49]
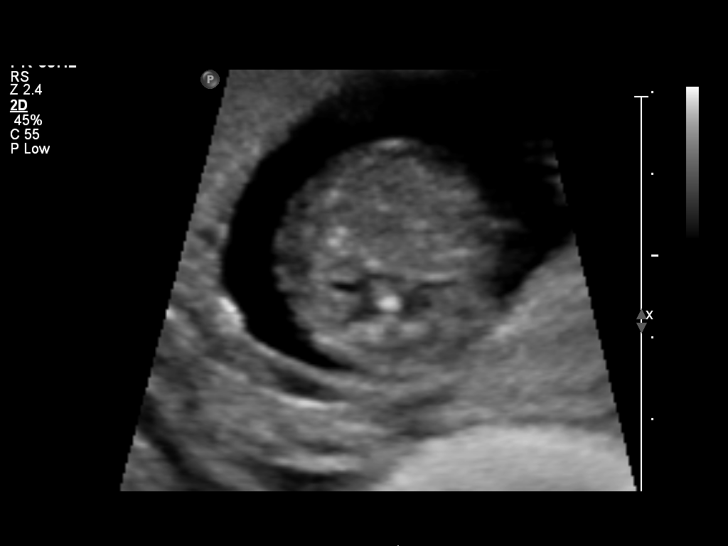
[im 31/49]
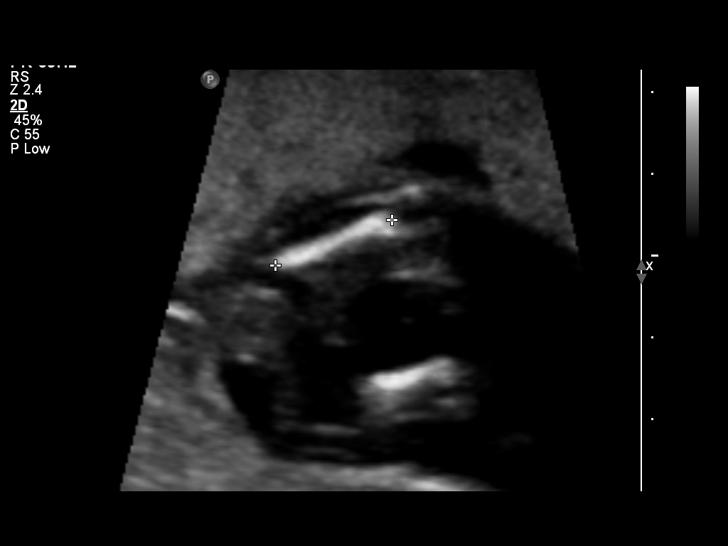
[im 34/49]
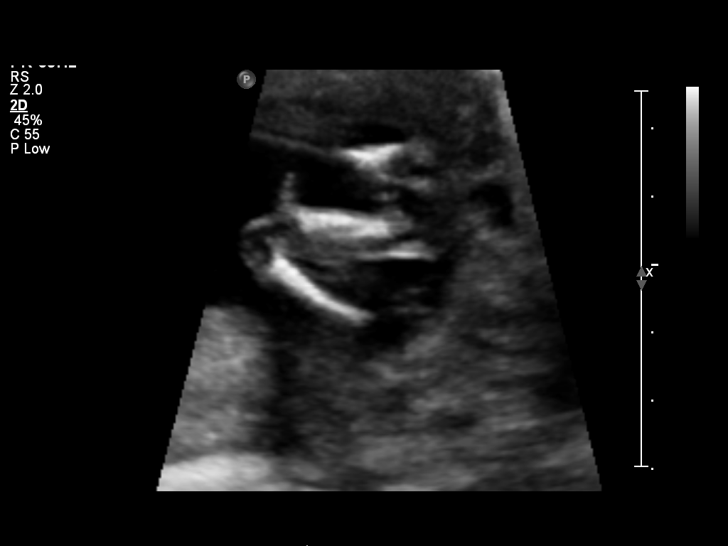
[im 38/49]
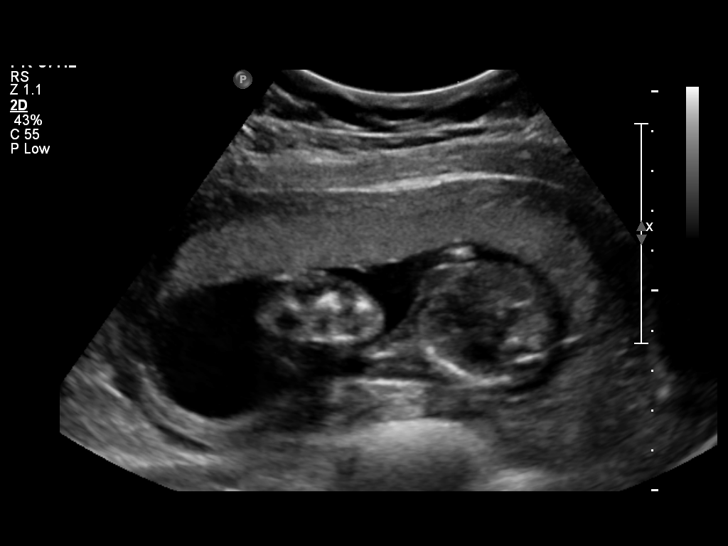
[im 41/49]
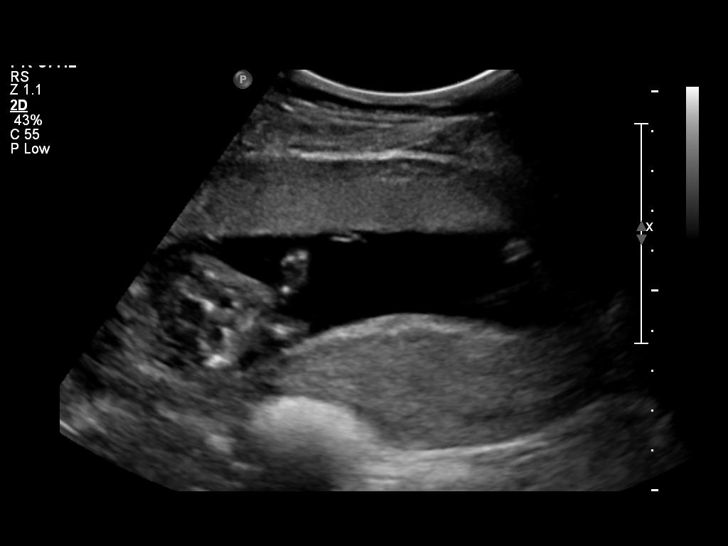
[im 45/49]
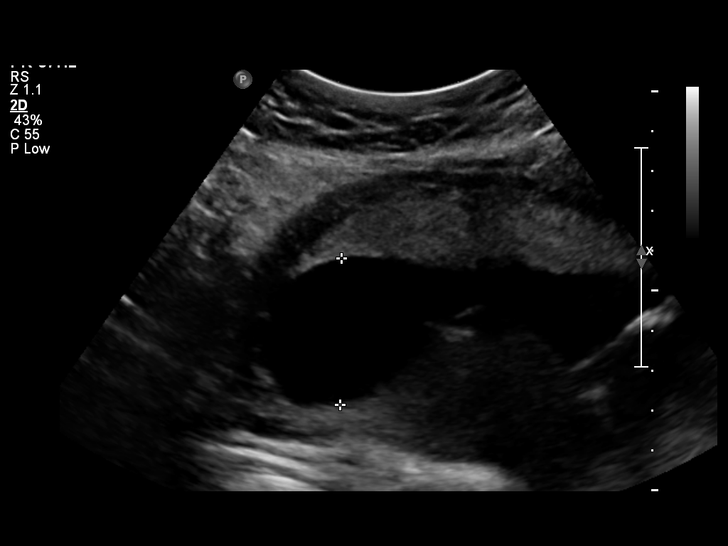
[im 49/49]
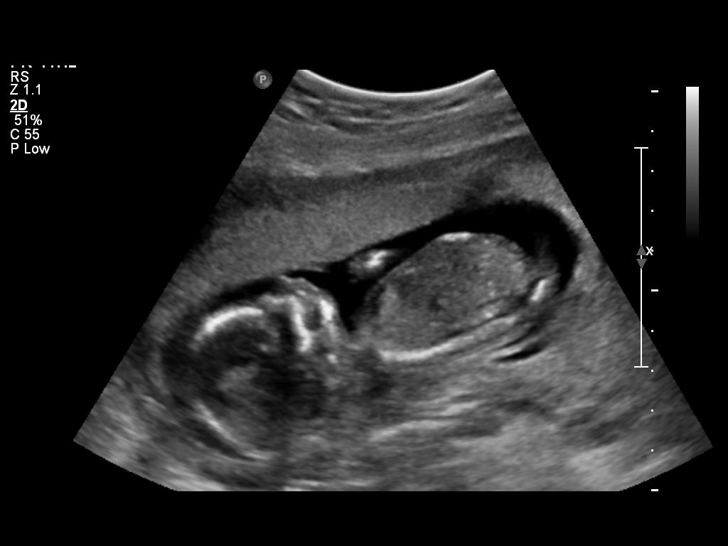

[14 of 28 positions shown; findings below may reference images not displayed]

IMPRESSION: See AS Obstetric US report.

## 2012-06-19 ENCOUNTER — Emergency Department (INDEPENDENT_AMBULATORY_CARE_PROVIDER_SITE_OTHER)
Admission: EM | Admit: 2012-06-19 | Discharge: 2012-06-19 | Disposition: A | Discharge status: Home / Self Care | Payer: Self-pay | Source: Home / Self Care | Attending: Emergency Medicine | Admitting: Emergency Medicine

## 2012-06-19 ENCOUNTER — Encounter (HOSPITAL_COMMUNITY): Payer: Self-pay

## 2012-06-19 DIAGNOSIS — O9279 Other disorders of lactation: Secondary | ICD-10-CM

## 2012-06-19 NOTE — ED Provider Notes (Signed)
History     CSN: [id number]  Arrival date & time 06/19/12  1211   First MD Initiated Contact with Patient 06/19/12 1406      Chief Complaint  Patient presents with  . Breast Pain    pt c/o of both breast hurting    (Consider location/radiation/quality/duration/timing/severity/associated sxs/prior treatment) HPI Comments: C/o bilateral breast pain that begin shortly after cessation of breast feeding about 8 d ago. Pain is spotty, but primarily in most anterior portions adjacent to the nipples. She is also taking OCP's for 3-4 months.    Breast exam:  There are lumpy, tender areas in anterior most breasts and the nipples have a clear amber discharge. No discoloration, redness or tense swelling. No evidence of mastitis. No surrounding lymphadenitis.   The history is provided by the patient.    History reviewed. No pertinent past medical history.  History reviewed. No pertinent past surgical history.  Family History  Problem Relation Age of Onset  . Osteoarthritis Mother     History  Substance Use Topics  . Smoking status: Never Smoker   . Smokeless tobacco: Not on file  . Alcohol Use: No    OB History    Grav Para Term Preterm Abortions TAB SAB Ect Mult Living   2 2 2  0 0 0 0 0 0 1      Review of Systems  Constitutional: Negative.   Respiratory: Negative.   Cardiovascular: Negative.   Gastrointestinal: Negative.   Genitourinary: Negative.   Musculoskeletal: Negative.   Skin: Negative.   Hematological: Negative.   Psychiatric/Behavioral: Negative.     Allergies  Review of patient's allergies indicates no known allergies.  Home Medications   Current Outpatient Rx  Name Route Sig Dispense Refill  . NORETHINDRONE 0.35 MG PO TABS Oral Take 1 tablet (0.35 mg total) by mouth daily. 1 Package 11  . NORGESTIM-ETH ESTRAD TRIPHASIC 0.18/0.215/0.25 MG-35 MCG PO TABS Oral Take 1 tablet by mouth daily. 1 Package 11  . PRE-NATAL FORMULA PO TABS Oral Take 1 tablet by  mouth daily.      Marland Kitchen FLUOCINONIDE 0.05 % EX SOLN Topical Apply topically 2 (two) times daily. 60 mL 6  . LORATADINE 10 MG PO CAPS Oral Take 1 capsule (10 mg total) by mouth 1 day or 1 dose. 30 each     BP 113/93  Pulse 110  Temp 98.1 F (36.7 C) (Oral)  Resp 16  SpO2 100%  LMP 06/18/2012  Breastfeeding? No  Physical Exam  Nursing note and vitals reviewed. Constitutional: She is oriented to person, place, and time. She appears well-developed and well-nourished.  Neck: Normal range of motion. Neck supple.  Cardiovascular: Normal rate, regular rhythm and normal heart sounds.   Pulmonary/Chest: Effort normal.  Abdominal: She exhibits no distension. There is no tenderness.  Musculoskeletal: Normal range of motion.  Neurological: She is alert and oriented to person, place, and time.  Psychiatric: She has a normal mood and affect. Her behavior is normal.    ED Course  Procedures (including critical care time)  Labs Reviewed - No data to display No results found.   1. Postpartum breast engorgement       MDM  Breast engorgement bilaterally without erythema or distention. Lobules proximal to nipple are tender and there is clear, amber fluid leaking from each breast. Motrin for pain, heat application and call [facility]        Hayden Rasmussen, NP 06/19/12 [phone]  Hayden Rasmussen, NP 06/22/12 2345  Hayden Rasmussen, NP 06/22/12 [phone]

## 2012-06-19 NOTE — ED Notes (Signed)
Stopped breastfeeding 8 days ago, has c/o of tenderness, denies n/v/d or fever.

## 2012-06-19 NOTE — ED Notes (Signed)
Pt states both breast are painful when breast feeding

## 2012-06-24 NOTE — ED Provider Notes (Signed)
Medical screening examination/treatment/procedure(s) were performed by non-physician practitioner and as supervising physician I was immediately available for consultation/collaboration.  Luiz Blare MD   Luiz Blare, MD 06/24/12 [phone]

## 2012-09-07 ENCOUNTER — Ambulatory Visit (INDEPENDENT_AMBULATORY_CARE_PROVIDER_SITE_OTHER): Payer: Self-pay | Admitting: Family Medicine

## 2012-09-07 ENCOUNTER — Encounter: Payer: Self-pay | Admitting: Family Medicine

## 2012-09-07 VITALS — BP 123/81 | HR 92 | Temp 98.9°F | Ht 61.5 in | Wt 126.3 lb

## 2012-09-07 DIAGNOSIS — L409 Psoriasis, unspecified: Secondary | ICD-10-CM

## 2012-09-07 DIAGNOSIS — L408 Other psoriasis: Secondary | ICD-10-CM

## 2012-09-07 MED ORDER — TRIAMCINOLONE ACETONIDE 0.1 % EX CREA: TOPICAL_CREAM | Freq: Two times a day (BID) | CUTANEOUS | Status: DC

## 2012-09-07 MED ORDER — CLOBETASOL PROPIONATE 0.05 % EX LIQD: CUTANEOUS | Status: DC

## 2012-09-12 NOTE — Progress Notes (Signed)
  Subjective:    Patient ID: April Taylor, female    DOB: Aug 29, 1986, 26 y.o.   MRN: [id number]  HPI  1. Rash:  Patient complaining of rash on scalp and on neck and abdomen.  Has had dx of psoriasis on scalp in the past and has used steroid solution which helps really well.  Has tried fluocinonide as well as clobetasol and both work about the same.  She is able to get clobetasol easier at the [facility] however.  Areas on neck and abdomen itchy as well but not as bad as scalp.  Has not tried anything on the areas of the body.  She denies any pain, drainage, fever or chills.   Review of Systems Per HPI    Objective:   Physical Exam  Constitutional: She appears well-nourished. No distress.  HENT:  Head: Normocephalic and atraumatic.  Skin:       Skin of scalp with multiple raised, scaly patches on scalp. Do not appear annular in formation and hair is intact over these areas.   Skin on neck  And abdomen with a few scattered areas with mild erythema and appears dry.          Assessment & Plan:

## 2012-09-12 NOTE — Assessment & Plan Note (Signed)
Refilled clobetasol as she states this is easier for her to obtain from [facility].  No signs of skin atrophy.  Areas on neck and abdomen do not appear to be classic psoriasis but given her history will have her try triamcinolone cream to see if this improves these areas.  Instructed to return if worsening of fails to improve.

## 2012-09-21 ENCOUNTER — Telehealth: Payer: Self-pay | Admitting: Family Medicine

## 2012-09-21 NOTE — Telephone Encounter (Signed)
Forward to PCP for cheaper Rx 

## 2012-09-21 NOTE — Telephone Encounter (Signed)
Pt is unable to buy CLOBETASOL PROPIONATE (TEMOVATE) it cost $154 can you please sent another RX to pt pharmacy with less cost.   Thank You   Marines

## 2012-09-24 NOTE — Telephone Encounter (Signed)
I can send in another similar rx, however if she does not have the orange card and can't get filled at [facility] all of these are going to be expensive.

## 2012-09-27 MED ORDER — CALCIPOTRIENE-BETAMETH DIPROP 0.005-0.064 % EX SUSP: Freq: Every day | CUTANEOUS | Status: DC

## 2012-09-27 NOTE — Telephone Encounter (Signed)
Patient requesting taclonex for scalp psoriasis. I feel this is likely to be as expensive but will print for her to check on prices.

## 2012-09-27 NOTE — Telephone Encounter (Signed)
I called pt on Friday and pt let RX in [facility] pt will try to resending RX to [facility]. Pt has OC. Pt will call us to let us know if she still need medication.   MJ

## 2012-09-27 NOTE — Telephone Encounter (Signed)
Forward to Marines to call patient with message.

## 2012-09-27 NOTE — Addendum Note (Signed)
Addended by: Everrett Coombe on: 09/27/2012 10:11 AM   Modules accepted: Orders

## 2013-01-11 ENCOUNTER — Ambulatory Visit (INDEPENDENT_AMBULATORY_CARE_PROVIDER_SITE_OTHER): Payer: No Typology Code available for payment source | Admitting: Family Medicine

## 2013-01-11 ENCOUNTER — Encounter: Payer: Self-pay | Admitting: Family Medicine

## 2013-01-11 ENCOUNTER — Telehealth: Payer: Self-pay | Admitting: *Deleted

## 2013-01-11 VITALS — BP 138/88 | HR 99 | Temp 98.0°F | Wt 129.0 lb

## 2013-01-11 DIAGNOSIS — B001 Herpesviral vesicular dermatitis: Secondary | ICD-10-CM

## 2013-01-11 DIAGNOSIS — B009 Herpesviral infection, unspecified: Secondary | ICD-10-CM

## 2013-01-11 MED ORDER — ACYCLOVIR 400 MG PO TABS: 400.0000 mg | ORAL_TABLET | Freq: Every day | ORAL | Status: DC

## 2013-01-11 MED ORDER — PENCICLOVIR 1 % EX CREA: TOPICAL_CREAM | CUTANEOUS | Status: DC

## 2013-01-11 MED ORDER — VALACYCLOVIR HCL 1 G PO TABS: 2000.0000 mg | ORAL_TABLET | Freq: Two times a day (BID) | ORAL | Status: DC

## 2013-01-11 NOTE — Telephone Encounter (Signed)
I will change prescription for acyclovir. Pt needs to pick it up.

## 2013-01-11 NOTE — Telephone Encounter (Signed)
Call received from [facility] pharmacy stating they do not have  valACYclovir ( valtrex ) but they do have acyclovir 400 mg . Also they do not have Denavir cream. Will forward to Dr. Aviva Signs.

## 2013-01-11 NOTE — Telephone Encounter (Signed)
New RX faxed to Pharmacy.

## 2013-01-11 NOTE — Patient Instructions (Addendum)
Herpes Simplex (Herpes Labial) (Herpes Labialis) Tiene una ampolla por fiebre o ampolla labial (herpes labialis). Estas llagas dolorosas estn causadas por uno de los virus del herpes (ms comunmente HSV1). Suelen encontrarse alrededor [employer] labios y la boca, Biomedical engineer la misma infeccin tambin puede afectar otras zonas de la cara como la nariz y la boca. Las infecciones por herpes demoran 10 das en curarse. Generalmente aparecen Neomia Dear y otra vez en el mismo sitio. Otros sntomas pueden incluir adormecimiento y hormigueo en la piel involucrada, Dentist, fiebre y glndulas inflamadas en el cuello. Los resfros, el Administrator, sports, las lesiones o la exposicin excesiva a la luz solar favorecen la reaparicin del herpes. Las infecciones por herpes son contagiosas. El contacto directo con estas llagas puede diseminar la infeccin.Tambin pueden diseminarse a otras partes del cuerpo. TRATAMIENTO  El herpes labialis suele ser autolimitado y desaparece en Elisabeth Most. Para disminuir el dolor y la inflamacin, aplique hielo en el labio de [place] frecuente o sorba un helado de agua. Se podrn utilizar medicamentos antivirales por va oral para disminuir la duracin de Manufacturing engineer. Evite la diseminacin de la infeccin lavndose las manos con frecuencia. Tenga cuidado de no tocar sus ojos o zonas genitales luego de tocar las ampollas. No bese ni tenga contacto ntimo con [employer]. Utilice pantalla solar para disminuir recurrencias.  Si esta es su primera infeccin con herpes, o si tiene infecciones graves o repetidas, el mdico le podra prescribir una droga antiviral para acelerar la curacin. Si tiene brotes a Scientist, water quality del uso de Civil engineer, contracting, deber comenzar con un tratamiento con medicamentos antivirales antes de una exposicin prolongada (ir a esquiar o a Teaching laboratory technician) para prevenir episodios futuros.  SOLICITE ATENCIN MDICA DE INMEDIATO SI OBSERVA:  Dolor de cabeza, somnolencia, fiebre alta, vmitos o debilidad  extrema.  Irritacin ocular, dolor, visin borrosa o enrojecimiento.  Si una infeccin prolongada no mejora en 10 das. Document Released: 10/06/2005 Document Revised: 12/29/2011 [facility] Patient Information 2013 [place], [place].  La proxima vez en cuanto comience con los sintomas tome la medicina como se le indica.

## 2013-01-12 DIAGNOSIS — B001 Herpesviral vesicular dermatitis: Secondary | ICD-10-CM | POA: Insufficient documentation

## 2013-01-12 NOTE — Assessment & Plan Note (Addendum)
Late for antiviral therapy but prescription given to treat further episodes as soon as the first symptoms appear. Education about condition. Hand out given.

## 2013-01-12 NOTE — Progress Notes (Signed)
Family Medicine Office Visit Note   Subjective:   Patient ID: April Taylor, female  DOB: 16-Apr-1986, 27 y.o.. MRN: [id number]   Pt that comes today for same day appointment. Visit conducted in Spanish. Her complain today is swollen upper lip. Pt has a PMHx of HSV1 and has had this ino  There occasion but never this bad. Her symptoms started 3 days ago. Denies fever, chills, headaches or other symptoms.   Review of Systems:  Per HPI.   Objective:   Physical Exam: Gen:  NAD HEENT: Moist mucous membranes. MOUTH: upper lip with multiple small closed vesicles on an erythematous base aproximatelly 1 cm that does not extends upper lip line. No pustular material. No other lesions.  CV: Regular rate and rhythm, no murmurs rubs or gallops PULM: Clear to auscultation bilaterally. No wheezes/rales/rhonchi Neuro: Alert and oriented x3. No focalization Skin: as described on upper lip. No other lesions or rashes.  Assessment & Plan:

## 2013-02-14 ENCOUNTER — Other Ambulatory Visit: Payer: Self-pay | Admitting: Family Medicine

## 2013-02-15 ENCOUNTER — Encounter: Payer: Self-pay | Admitting: Family Medicine

## 2013-02-15 ENCOUNTER — Ambulatory Visit (INDEPENDENT_AMBULATORY_CARE_PROVIDER_SITE_OTHER): Payer: Self-pay | Admitting: Family Medicine

## 2013-02-15 VITALS — BP 120/86 | HR 98 | Temp 98.1°F | Wt 127.3 lb

## 2013-02-15 DIAGNOSIS — L409 Psoriasis, unspecified: Secondary | ICD-10-CM

## 2013-02-15 DIAGNOSIS — L408 Other psoriasis: Secondary | ICD-10-CM

## 2013-02-15 DIAGNOSIS — J019 Acute sinusitis, unspecified: Secondary | ICD-10-CM

## 2013-02-15 MED ORDER — AMOXICILLIN 500 MG PO CAPS: 500.0000 mg | ORAL_CAPSULE | Freq: Three times a day (TID) | ORAL | Status: DC

## 2013-02-15 MED ORDER — LORATADINE 10 MG PO CAPS: 10.0000 mg | ORAL_CAPSULE | Freq: Every day | ORAL | Status: DC

## 2013-02-15 NOTE — Progress Notes (Signed)
Interpreter Wyvonnia Dusky for Dr Ellin Mayhew

## 2013-02-15 NOTE — Assessment & Plan Note (Signed)
May be allergic vs bacterial.  Will rx 5 day course of amoxicillin and advised on antihistamine daily.  Discussed allergen avoidance, red flags for return, patient handout given.  Follow-up prn

## 2013-02-15 NOTE — Progress Notes (Signed)
  Subjective:    Patient ID: April Taylor, female    DOB: 04/22/1986, 27 y.o.   MRN: [id number]  HPI  Here for same day appt for evaluation  Of 'allergies"  States has headaches and facial pressure for 8 days.  Reports both runny and stuffy nose.  Headache described as throbbing pain located frontal and occipital rates as 6/10. No fever, cough, or dyspnea.  Has been worsening throughout week.  Has tried advil and claritin for symptoms.  Has been taking advil daily for the past week, and claritin.  I have reviewed patient's  PMH, FH, and Social history and Medications as related to this visit.  Review of Systems     Objective:   Physical ExamSpanish interpreter in room  GEN: Alert & Oriented, No acute distress HEENT: [place]/AT. EOMI, PERRLA, no conjunctival injection or scleral icterus.  Bilateral tympanic membranes intact without erythema or effusion.  .  Nares without edema or rhinorrhea.  Oropharynx is without erythema or exudates.  No anterior or posterior cervical lymphadenopathy. Tender over bilateral maxillary sinuses CV:  Regular Rate & Rhythm, no murmur Respiratory:  Normal work of breathing, CTAB        Assessment & Plan:

## 2013-02-15 NOTE — Patient Instructions (Addendum)
Sinusitis   (Sinusitis)  La sinusitis es el enrojecimiento, dolor e hinchazón (inflamación) de los senos paranasales. Los senos paranasales son bolsas de aire que se encuentran dentro de los huesos del rostro (por debajo de los ojos, en la mitad de la frente o por encima de los ojos). En los senos paranasales sanos, el moco es capaz de drenar y el aire circula a través de ellos en su camino hacia la nariz. Sin embargo, cuando se inflaman, el moco y el aire quedan atrapados. Esto hace que se desarrollen bacterias y otros gérmenes y originen una infección.    La sinusitis puede desarrollarse rápidamente y durar sólo un tiempo corto (aguda) o continuar por un período largo (crónica).. La sinusitis que dura más de 12 semanas se considera crónica.   CAUSAS   Las causas de la sinusitis son:   · Alergias.  · Las anomalías estructurales, como el desplazamiento del cartílago que separa las fosas nasales (desvío del tabique) pueden disminuir el flujo de aire por la nariz y los senos paranasales y afectar su drenaje.  · Las alteraciones funcionales, como cuando los pequeños pelos (cilias) que se encuentran en los senos nasales y ayudan a eliminar la mucosidad no funcionan correctamente o no están presentes.  SÍNTOMAS   Los síntomas de sinusitis aguda y crónica son los mismos. Los síntomas principales son el dolor y la presión alrededor de los senos paranasales afectados. Otros síntomas son:   · Dolor en los dientes superiores.  · Dolor de oídos  · Dolor de cabeza.  · Mal aliento.  · Disminución del sentido del olfato y del gusto.  · Tos, que empeora al acostarse.  · Fatiga.  · Fiebre.  · Drenaje de moco espeso por la nariz, que generalmente es de color verde y puede contener pus (purulento).  · Hinchazón y calor en los senos paranasales afectados.  DIAGNÓSTICO   El médico le hará un examen físico. Durante el examen, el médico:   · Revisará su nariz buscando signos de crecimientos anormales en las fosas nasales (pólipos  nasales).  · Palpará los senos paranasales afectados para buscar signos de infección.  · Observará el interior de los senos paranasales (endoscopía) con un dispositivo luminoso especial (endoscopio) con el que tomará imágenes insertándolo en los senos paranasales.  Si el médico sospecha que usted sufre sinusitis crónica, podrá indicar una o más de las siguientes pruebas:   · Pruebas de alergia.  · Cultivo de secreciones nasales: tomará una muestra del moco nasal y lo enviará a un laboratorio para detectar bacterias.  · Citología nasal: el médico tomará una muestra de moco de la nariz para determinar si la sinusitis que usted sufre está relacionada con una alergia.  TRATAMIENTO   La mayoría de los casos de sinusitis aguda se deben a una infección viral y se resuelven espontáneamente dentro de los 10 días. En algunos casos se recetan medicamentos para aliviar los síntomas (analgésicos, descongestivos, aerosoles nasales con corticoides o aerosoles salinos).   Sin embargo, para la sinusitis por infección bacteriana, el médico le recetará antibióticos. Los antibióticos son medicamentos que destruyen las bacterias que causan la infección.   Rara vez la sinusitis tiene su origen en una infección por hongos. . En estos casos, el médico le recetará un medicamento antifúngico.   Para algunos casos de sinusitis crónica, es necesario someterse a una cirugía. Generalmente se trata de casos en los que la sinusitis se repite más de 3 veces al año, a pesar de   otros tratamientos.   INSTRUCCIONES PARA EL CUIDADO EN EL HOGAR   · Beba gran cantidad de líquidos. Los líquidos ayudan a disolver el moco para que drene más fácilmente de los senos paranasales.  · Use un humidificador.  · Inhale vapor de 3 a 4 veces al día (por ejemplo, siéntese en el baño con la ducha abierta).  · Aplique un paño tibio y húmedo en el rostro 3 ó 4 veces al día, o según las indicaciones de su médico.  · Use un aerosol nasal salino para ayudar a humedecer y  limpiar los senos nasales.  · Tome medicamentos de venta libre o recetados para el aliviar el dolor, el malestar o la fiebre sólo según las indicaciones de su médico.  SOLICITE ATENCIÓN MÉDICA DE INMEDIATO SI:   · Siente más dolor o sufre dolores de cabeza intensos.  · Tiene náuseas, vómitos o somnolencia.  · Observa hinchazón alrededor del rostro.  · Tiene problemas de visión.  · Presenta rigidez en el cuello.  · Tiene dificultad para respirar.  ASEGÚRESE DE QUE:   · Comprende estas instrucciones.  · Controlará su enfermedad.  · Solicitará ayuda de inmediato si no mejora o si empeora.  Document Released: 07/16/2005 Document Revised: 12/29/2011  [employer]® Patient Information ©2013 [employer].

## 2013-03-11 ENCOUNTER — Telehealth: Payer: Self-pay | Admitting: Family Medicine

## 2013-03-11 MED ORDER — NORGESTIM-ETH ESTRAD TRIPHASIC 0.18/0.215/0.25 MG-35 MCG PO TABS: ORAL_TABLET | ORAL | Status: DC

## 2013-03-11 NOTE — Telephone Encounter (Signed)
Pt came to request birthcontrol pills, pt has OC appt on 06/24 after that day pt will set up an appt for CPE, But pt will like you send to the pharmacy one more month of  birthcontrol pills.  Thank You  Marines

## 2013-05-16 ENCOUNTER — Other Ambulatory Visit: Payer: Self-pay | Admitting: Family Medicine

## 2013-05-16 NOTE — Telephone Encounter (Signed)
Pt needs prescription CLOBETASOL sent to the pharmacy pt use [facility] on [address] phone #[phone].

## 2013-05-27 ENCOUNTER — Ambulatory Visit: Payer: Self-pay | Admitting: Family Medicine

## 2013-06-01 ENCOUNTER — Encounter: Payer: Self-pay | Admitting: Family Medicine

## 2013-06-01 ENCOUNTER — Other Ambulatory Visit (HOSPITAL_COMMUNITY)
Admission: RE | Admit: 2013-06-01 | Discharge: 2013-06-01 | Disposition: A | Discharge status: Home / Self Care | Payer: Self-pay | Source: Ambulatory Visit | Attending: Family Medicine | Admitting: Family Medicine

## 2013-06-01 ENCOUNTER — Ambulatory Visit (INDEPENDENT_AMBULATORY_CARE_PROVIDER_SITE_OTHER): Payer: Self-pay | Admitting: Family Medicine

## 2013-06-01 VITALS — BP 114/78 | HR 79 | Ht 61.5 in | Wt 130.0 lb

## 2013-06-01 DIAGNOSIS — IMO0001 Reserved for inherently not codable concepts without codable children: Secondary | ICD-10-CM

## 2013-06-01 DIAGNOSIS — Z113 Encounter for screening for infections with a predominantly sexual mode of transmission: Secondary | ICD-10-CM | POA: Insufficient documentation

## 2013-06-01 DIAGNOSIS — Z01419 Encounter for gynecological examination (general) (routine) without abnormal findings: Secondary | ICD-10-CM

## 2013-06-01 DIAGNOSIS — Z309 Encounter for contraceptive management, unspecified: Secondary | ICD-10-CM

## 2013-06-01 DIAGNOSIS — Z1151 Encounter for screening for human papillomavirus (HPV): Secondary | ICD-10-CM | POA: Insufficient documentation

## 2013-06-01 LAB — POCT WET PREP (WET MOUNT)
Clue Cells Wet Prep Whiff POC: NEGATIVE
WBC, Wet Prep HPF POC: >20

## 2013-06-01 LAB — POCT URINE PREGNANCY: Preg Test, Ur: NEGATIVE

## 2013-06-01 MED ORDER — NORGESTIM-ETH ESTRAD TRIPHASIC 0.18/0.215/0.25 MG-35 MCG PO TABS: ORAL_TABLET | ORAL | Status: DC

## 2013-06-01 NOTE — Progress Notes (Signed)
Patient ID: April Taylor, female   DOB: Oct 14, 1986, 27 y.o.   MRN: [id number]   HPI:  Pt is here for a well woman visit. She has no complaints today and feels well. She gets her periods every month. They are not particularly heavy. Does not have pain with them. She takes contraceptive pills, these work well for her. Has had two prior pregnancies and has two sons. Currently sexually active with one female partner, has only had one partner in the last year. No pelvic pain or vaginal discharge. Is not concerned for STD's today but would like to be tested.   ROS: See HPI  PMFSH: No medical problems. Only medicine is contraceptive pills. Doesn't smoke cigarettes, drinks alcohol or use drugs.  PHYSICAL EXAM: BP 114/78  Pulse 79  Ht 5' 1.5" (1.562 m)  Wt 130 lb (58.968 kg)  BMI 24.17 kg/m2 Gen: NAD HEENT: no cervical or supraclavicular lymphadenopathy. MMM. Heart: RRR Lungs: CTAB Abd: +BS. Soft, nontender to palpation, no masses or organomegaly GU: normal appearing external genitalia. No lesions noted. Normal appearing discharge in the vagina. Cervical ectropion present. No adnexal masses or tenderness, no cervical motion tenderness. Neuro: nonfocal grossly, speech intact  ASSESSMENT/PLAN:  # Health maintenance:  -pap smear done today -pregnancy test negative, so refilled OCP's for one year -ordered gc/chlamydia, wet prep, HIV, RPR for STD screening -f/u in 1 year for routine health maintenance visit  Note: at end of visit patient requested refill on scalp steroid shampoo, which she uses for psoriasis. She did not identify this as an issue at the beginning of the visit when we set the agenda for the visit, nor did she endorse this as a medication when we reviewed her meds. Instructed her to schedule a separate appointment to address this issue, as we were already out of time and this was not something we addressed or which I examined during today's visit.

## 2013-06-01 NOTE — Patient Instructions (Signed)
It was nice to meet you today!  I will call you if your test results are not normal.  Otherwise, I will send you a letter.  If you do not hear from me with in 2 weeks please call our office.     Follow up in one year for another physical, or sooner if needed.  Be well, Dr. Pollie Meyer   Mantenimiento de la salud en las mujeres (Health Maintenance, Females) Un estilo de vida saludable y los cuidados preventivos pueden favorecer la salud y [place].   Haga exmenes regulares de la salud en general, dentales y de los ojos.  Consuma una dieta saludable. Los Sun [employer], frutas, granos enteros, productos lcteos descremados y protenas magras contienen los nutrientes que usted necesita sin necesidad de consumir muchas caloras. Disminuya el consumo de alimentos con alto contenido de grasas slidas, azcar y sal agregadas. Si es necesario, pdaleinformacin acerca de una dieta [place] a su mdico.  La actividad fsica regular es una de las cosas ms importantes que puede hacer por su salud. Los adultos deben hacer al menos 150 minutos de ejercicios de intensidad moderada (cualquier actividad que aumente la frecuencia cardaca y lo haga transpirar) cada semana. Adems, la Harley[employer]son de los adultos necesita ejercicios de fortalecimiento muscular 2  ms [employer] and [employer].   Mantenga un peso saludable. El ndice de masa corporal [facility]) es una herramienta que identifica posibles problemas con [place]. Proporciona una estimacin de la grasa corporal basndose en el peso y la altura. El mdico podr determinar su [facility] y podr ayudarlo a Personnel officer o Pharmacologist un peso saludable. Para los adultos de 20 aos o ms:  Un [facility] menor a 18,5 se considera bajo peso.  Un [facility] entre 18,5 y 24,9 es normal.  Un [facility] entre 25 y 29,9 es sobrepeso.  Un IMC entre 30 o ms es obesidad.  Mantenga un nivel normal de lpidos y colesterol en sangre practicando actividad fsica y minimizando la ingesta de grasas  saturadas. Consuma una dieta balanceada e incluya variedad de frutas y vegetales. Los [employer] Corporation de lpidos y Oncologist en sangre deben Games developer a los 20 aos y repetirse cada 5 aos. Si los niveles de colesterol son altos, tiene ms de 50 aos o tiene riesgo elevado de sufrir enfermedades cardacas, Pension scheme manager controlarse con ms frecuencia.Si tiene [employer] de lpidos y colesterol, debe recibir tratamiento con medicamentos, si la dieta y el ejercicio no son efectivos.  Si fuma, consulte con [place] [employer] de las opciones para dejar de [place]. Si no lo hace, no comience.  Si est embarazada no beba alcohol. Si est amamantando, beba alcohol con prudencia. Si elige beber alcohol, no se exceda de 1 medida por da. Se considera una medida a 12 onzas (355 ml) de cerveza, 5 onzas (148 ml) de vino, o 1,5 onzas (44 ml) de licor.  Evite el alcohol y el consumo de drogas. No comparta agujas. Pida ayuda si necesita asistencia o instrucciones con respecto a abandonar el consumo de alcohol, cigarrillos o drogas.  La hipertensin arterial causa enfermedades cardacas y [place] el riesgo de ictus. Debe controlar su presin arterial al menos cada 1 o 2 aos. La presin arterial elevada que persiste debe tratarse con medicamentos si la prdida de peso y el ejercicio no son efectivos.  Si tiene entre 55 y 29 aos, consulte a su mdico si debe tomar aspirina para prevenir enfermedades cardacas.  Los anlisis para la diabetes incluyen la toma de [place] de  sangre para Geophysical data processor de azcar en la sangre durante el [place]. Debe hacerlo cada 3 aos despus de los 45 aos si est dentro de su peso normal y sin factores de riesgo para la diabetes. Las pruebas deben comenzar a edades tempranas o llevarse a cabo con ms frecuencia si tiene sobrepeso y al menos 1 factor de riesgo para la diabetes.  Las evaluaciones para Market researcher de mama son un mtodo preventivo fundamental para las mujeres.  Debe practicar la "autoconciencia de las mamas". Esto significa que Product/process development scientist apariencia normal de sus mamas y [employer] siente y pudiendo incluir un autoexamen de Building control surveyor. Si detecta algn cambio, no importa cun pequeo sea, debe informarlo a su mdico. Las mujeres entre 20 y 40 aos deben hacer un examen clnico de las mamas como parte del examen regular de [place], cada 1 a 3 aos. Despus de los 40 aos deben Sprint Nextel Corporation. Deben hacerse una mamografa radografa de mamas ) cada ao, comenzando a los 40 aos. Las mujeres con Brewing technologist de cncer de mama deben hablar con el mdico para hacer un estudio gentico. Las que tienen ms riesgo deben Geographical information systems officer resonancia magntica y [employer] todos [place].  Un test de Pap se realiza para diagnosticar cncer de cuello de tero. Las mujeres deben [employer] un test de Pap a partir de los [address] [employer]ne[employer] 21 y los 29 aos debe repetirse [address]. Luego de los 30 aos, debe realizarse un test de Pap cada tres aos siempre que los 3 estudios anteriores sean normales. Si le han realizado una histerectoma por un problema que no era cncer u otra enfermedad que podra causar cncer, ya no necesitar un test de Pap. Si tiene entre 65 y 58 aos y ha tenido Scientist, research (physical sciences) de Pap normal en los ltimos 10 aos, ya no ser Music therapist. Si ha recibido un tratamiento para Management consultant cervical o para una enfermedad que podra causar cncer, Musician un test de Pap y controles durante al menos 20 aos de concluir el [place]. Si no se ha Patent attorney con regularidad, Writer a evaluarse los factores de riesgo (como el tener un nuevo compaero sexual) para Occupational psychologist a Printmaker. Algunas mujeres sufren problemas mdicos que aumentan la probabilidad de Research officer, political party cervical. En estos casos, el mdico podr indicar que se realice el test de Pap con ms frecuencia.  La prueba del virus del Pensions consultant (VPH) es un anlisis adicional que puede usarse para Engineer, site de cuello de tero. Esta prueba busca la presencia del virus que causa los cambios en el cuello. Las clulas que se recolectan durante el test de Pap pueden usarse para el VPH. La prueba para el VPH puede usarse para Development worker, community a mujeres de ms de 30 aos y debe usarse en mujeres de cualquier edad Cisco del test de Pap no sean claros. Despus de los 30 aos, las mujeres deben hacerse el anlisis para el VPH con la misma frecuencia que el test de Pap.  El cncer colorectal puede detectarse y con frecuencia puede prevenirse. La mayor parte de los estudios de rutina comienzan a los 50 aos y Liz Claiborne 75 aos. Sin embargo, el mdico podr aconsejarle que lo haga antes, si tiene factores de riesgo para el cncer de colon. Una vez por ao, el profesional le dar un kit de prueba para Scientist, product/process development en la  materia fecal. La utilizacin de un tubo con una pequea cmara en su extremo para examinar directamente el colon (sigmoidoscopa o colonoscopa), puede detectar formas temprana de cncer colorectal. Hable con su mdico si tiene 50 aos, cuando comience con los estudios de [place]. El examen directo del colon debe repetirse cada 5 a 10 aos, hasta los 75 aos, excepto que se encuentren formas tempranas de plipos precancerosos o pequeos bultos.  Se recomienda realizar un anlisis de sangre para Engineer, manufacturing hepatitis C a todas las personas [address] 1947 y 1967, y a todo aquel que tenga un riesgo conocido de haber contrado esta enfermedad.  Practique el sexo seguro. Use condones y evite las prcticas sexuales riesgosas para disminuir el contagio de enfermedades de transmisin sexual. Las mujeres sexualmente activas de 25 aos o menos deben controlarse para descartar clamidia, que es una infeccin de transmisin sexual frecuente. Las Coca Cola que tengan mltiples compaeros tambin deben hacerse el anlisis  para Engineer, manufacturing clamidia. Se recomienda realizar anlisis para detectar otras enfermedades de transmisin sexual si es sexualmente [place] y tiene riesgos.  La osteoporosis es una enfermedad en la que los huesos pierden los minerales y la fuerza por el avance de la edad. El resultado pueden ser fracturas graves en los [place]. El riesgo de osteoporosis puede identificarse con Neomia Dear prueba de densidad sea. Las mujeres de ms de 65 aos y las que tengan riesgos de sufrir fracturas u osteoporosis deben pedir consejo a su mdico. Consulte a su mdico si debe tomar un suplemento de calcio o de vitamina D para reducir el riesgo de osteoporosis.  La menopausia se asocia a sntomas y riesgos fsicos. Se dispone de una terapia de reemplazo hormonal para disminuir los sntomas y [place]. Consulte a su mdico para saber si la terapia de reemplazo hormonal es conveniente para usted.  Use una pantalla solar con un factor SPF de 30 o mayor. Aplique pantalla de Pietro Cassis y repetida a lo largo del Futures trader. Pngase al resguardo del sol cuando la sombra sea ms pequea que usted. Protjase usando mangas y Automatic Data, un sombrero de ala ancha y gafas para el sol todo el ao, siempre que se encuentre en el exterior.  Informe a su mdico si aparecen nuevos lunares o los que tiene se modifican, especialmente en forma y color. Tambin notifique al mdico si un lunar es ms grande que el tamao de una goma de Paramedic.  Mantngase al da con las vacunas. Document Released: 09/25/2011 Document Revised: 12/29/2011 [facility] Patient Information 2014 [place], [place].

## 2013-06-06 ENCOUNTER — Ambulatory Visit (INDEPENDENT_AMBULATORY_CARE_PROVIDER_SITE_OTHER): Payer: Self-pay | Admitting: Family Medicine

## 2013-06-06 ENCOUNTER — Encounter: Payer: Self-pay | Admitting: Family Medicine

## 2013-06-06 VITALS — BP 126/86 | HR 80 | Ht 61.5 in | Wt 132.5 lb

## 2013-06-06 DIAGNOSIS — L409 Psoriasis, unspecified: Secondary | ICD-10-CM

## 2013-06-06 DIAGNOSIS — L408 Other psoriasis: Secondary | ICD-10-CM

## 2013-06-06 MED ORDER — CLOBETASOL PROPIONATE 0.05 % EX SOLN: 1.0000 "application " | Freq: Two times a day (BID) | CUTANEOUS | Status: DC

## 2013-06-06 NOTE — Patient Instructions (Addendum)
Favor de regresar en 12 meses para otro chequeo de psoriasis Please return in one year for evaluation of yourPsoriasis (Psoriasis)  La psoriasis es una enfermedad (crnica) de la piel. Puede causar ronchas rojas, una erupcin, escamas que se desprenden, sangrado y Engineer, mining en las articulaciones (artritis).Generalmente afecta a los codos, rodillas, ingle, genitales, brazos, piernas, cuero cabelludo y uas. No puede transmitirse de persona a persona (no es contagiosa).  CUIDADOS EN EL HOGAR   Slo tome la medicacin segn las indicaciones.  Cumpla con los controles mdicos segn las indicaciones. Podra necesitar probar varios tratamientos para ver cual es mejor para usted.  Evite tomar sol, cortarse, rascarse, consumir alcohol, fumar y el estrs.  Use guantes al lavar los platos, al limpiar y cuando est afuera en el fro.  Mantenga el aire de su casa hmedo y Holiday representative. Puede usar un humidificador.  Aplquese una locin inmediatamente despus del bao o la ducha.  Evite los baos o las duchas calientes prolongados. No utilice mucho jabn.  Beba gran cantidad de lquido para mantener la orina de tono claro o color amarillo plido. SOLICITE AYUDA DE INMEDIATO SI:   Aumenta el dolor en las zonas afectadas.  Aparece una hemorragia que no se detiene en las zonas afectadas.  Aumentan el enrojecimiento y Company secretary en la zona de la herida.  Las articulaciones le duelen o estn rgidas.  Se siente deprimida.  Tiene fiebre. ASEGRESE DE QUE:   Comprende estas instrucciones.  Controlar su enfermedad.  Solicitar ayuda de inmediato si no mejora o si empeora. Document Released: 10/06/2005 Document Revised: 12/29/2011 [facility] Patient Information 2014 [place], [place].

## 2013-06-06 NOTE — Progress Notes (Signed)
  Subjective:    Patient ID: April Taylor, female    DOB: Mar 19, 1986, 27 y.o.   MRN: [id number]  HPI Scalp psoriasis - here for refill of the Temovate topical solution which works well as long as she applies it twice daily, although it never disappears completely. It does itch. No other skin lesions or arthritic symptoms.   Her mother has arthritis, but not psoriasis.   She feels well otherwise  Interview conducted in Spanish  Review of Systems     Objective:   Physical Exam  Constitutional: She appears well-developed and well-nourished.  Skin: Rash noted.  Superficially scaling mildly erythematous plaque occipital scalp without hair loss          Assessment & Plan:

## 2013-06-06 NOTE — Assessment & Plan Note (Signed)
Fair control with the Temovate. Will renew for one year. Print out in Spanish given regarding basics of psoriasis

## 2013-06-07 ENCOUNTER — Telehealth: Payer: Self-pay | Admitting: Family Medicine

## 2013-06-07 NOTE — Telephone Encounter (Signed)
Medicine sent to pharmacy is to expensive can she have a medicine less expensive sent to pharmacy..the patient do not speak good english and son was speaking for her.

## 2013-06-07 NOTE — Telephone Encounter (Signed)
Will fwd. To Dr.Hale for review. .April Taylor  

## 2013-06-17 NOTE — Telephone Encounter (Signed)
I am not aware of a less expensive medicine to replace it.

## 2013-07-14 ENCOUNTER — Encounter: Payer: Self-pay | Admitting: Family Medicine

## 2013-12-23 ENCOUNTER — Encounter: Payer: Self-pay | Admitting: *Deleted

## 2013-12-23 NOTE — Telephone Encounter (Signed)
[facility] call to see why pt could not get medication filled forclobetasol (TEMOVATE) 0.05 % external solution.  Per pharmacy tech, pt just need to just come in and pick up medication and cost is $211.  Marines called pt back to inform her and pt [facility].  Marines [facility].  Clovis PuMartin, Cattaleya Wien L, RN

## 2014-08-21 ENCOUNTER — Encounter: Payer: Self-pay | Admitting: Family Medicine

## 2015-01-03 ENCOUNTER — Encounter: Payer: Self-pay | Admitting: Family Medicine

## 2015-01-03 ENCOUNTER — Ambulatory Visit (INDEPENDENT_AMBULATORY_CARE_PROVIDER_SITE_OTHER): Payer: Self-pay | Admitting: Family Medicine

## 2015-01-03 VITALS — BP 134/91 | HR 91 | Temp 98.1°F | Ht 61.5 in | Wt 137.5 lb

## 2015-01-03 DIAGNOSIS — Z30011 Encounter for initial prescription of contraceptive pills: Secondary | ICD-10-CM

## 2015-01-03 DIAGNOSIS — N926 Irregular menstruation, unspecified: Secondary | ICD-10-CM

## 2015-01-03 DIAGNOSIS — Z3200 Encounter for pregnancy test, result unknown: Secondary | ICD-10-CM

## 2015-01-03 LAB — POCT URINE PREGNANCY: PREG TEST UR: NEGATIVE

## 2015-01-03 MED ORDER — NORGESTIM-ETH ESTRAD TRIPHASIC 0.18/0.215/0.25 MG-35 MCG PO TABS: ORAL_TABLET | ORAL | Status: DC

## 2015-01-03 NOTE — Progress Notes (Signed)
Patient ID: April Taylor, female   DOB: 03-11-1986, 29 y.o.   MRN: [id number]  HPI:  Menstrual irregularity: LMP was Jan 20th. Periods are normally very regular. Took several home pregnancy tests at home and these were all negative. Prior to this had monthly periods. Was not hoping to be pregnant.  Used to be on OCP's. Stopped those about a year ago. Not doing anything to prevent pregnancy now. She tolerated the pills well. Wants a refill. Stopped them because she ran out. Last unprotected intercourse was 8 days ago. No hx of blood clots. Nonsmoker. No hx of migraine with aura.  Hearing issues: Patient wants to check her ears because she occasionally hears noises. Feels like she doesn't hear quite as well. This does not bother her. She is not sure how long it's been going on. It only occurs occasionally. It does not bother her enough that she would want to see an ear doctor for this.  ROS: See HPI.  PMFSH: History of menometrorrhagia, herpes labialis, psoriasis  PHYSICAL EXAM: BP 134/91 mmHg  Pulse 91  Temp(Src) 98.1 F (36.7 C) (Oral)  Ht 5' 1.5" (1.562 m)  Wt 137 lb 8 oz (62.37 kg)  BMI 25.56 kg/m2  LMP 11/08/2014 Gen: NAD, pleasant, cooperative HEENT: Normocephalic, atraumatic, oropharynx clear and moist, TMs clear bilaterally Abd: soft NTTP Neuro: grossly nonfocal, speech intact Ext: No appreciable lower extremity edema bilaterally   ASSESSMENT/PLAN:  Hearing concern Normal ears on exam. As patient states she would not want any further workup for this and it does not bother her, we will continue to observe at this time.  Contraception management Patient prefers to go back on oral contraceptives. She missed her period in February, and has not yet gotten it in March. Pregnancy test negative. Oral contraceptives will help regulate her menses. As instructed her to take the birth control pills on the first day of her next period. If she does not get a period by the end of  April, she will need to come in for an appointment to further evaluate secondary amenorrhea. I was very specific with patient about not taking the medication until she starts her period and she stated her understanding.    FOLLOW UP: F/u in 4 months for contraceptive management, or sooner if does not begin menstrual cycle by the end of April.  GrenadaBrittany J. Pollie MeyerMcIntyre, MD [facility]

## 2015-01-03 NOTE — Patient Instructions (Signed)
Start the birth control pills when you get your period Do NOT take them until you get a period If your period doesn't come by the end of April, return for a visit.  Follow up with me in 4 months to see how things are going. If your hearing starts to bother you more, let me know.  Be well, Dr. Pollie MeyerMcIntyre

## 2015-01-05 DIAGNOSIS — Z309 Encounter for contraceptive management, unspecified: Secondary | ICD-10-CM | POA: Insufficient documentation

## 2015-01-05 NOTE — Assessment & Plan Note (Addendum)
Patient prefers to go back on oral contraceptives. She missed her period in February, and has not yet gotten it in March. Pregnancy test negative. Oral contraceptives will help regulate her menses. As instructed her to take the birth control pills on the first day of her next period. If she does not get a period by the end of April, she will need to come in for an appointment to further evaluate secondary amenorrhea. I was very specific with patient about not taking the medication until she starts her period and she stated her understanding.

## 2015-01-31 ENCOUNTER — Encounter: Payer: Self-pay | Admitting: Family Medicine

## 2015-01-31 ENCOUNTER — Ambulatory Visit (INDEPENDENT_AMBULATORY_CARE_PROVIDER_SITE_OTHER): Payer: Self-pay | Admitting: Family Medicine

## 2015-01-31 VITALS — BP 120/79 | HR 74 | Temp 97.8°F | Ht 61.5 in | Wt 139.0 lb

## 2015-01-31 DIAGNOSIS — L409 Psoriasis, unspecified: Secondary | ICD-10-CM

## 2015-01-31 MED ORDER — TRIAMCINOLONE ACETONIDE 0.1 % EX CREA
1.0000 | TOPICAL_CREAM | Freq: Two times a day (BID) | CUTANEOUS | Status: DC
Start: 2015-01-31 — End: 2016-08-15

## 2015-01-31 MED ORDER — CLOBETASOL PROPIONATE 0.05 % EX SOLN: 1.0000 "application " | Freq: Two times a day (BID) | CUTANEOUS | Status: DC

## 2015-01-31 MED ORDER — TRIAMCINOLONE ACETONIDE 0.5 % EX OINT: 1.0000 "application " | TOPICAL_OINTMENT | Freq: Two times a day (BID) | CUTANEOUS | Status: DC

## 2015-01-31 NOTE — Progress Notes (Signed)
   Subjective:    Patient ID: April Taylor, female    DOB: 11-Mar-1986, 29 y.o.   MRN: [id number]  HPI  CC: medication refill  # Psoriasis:  Diagnosed with psoriasis and given creams and solution in the past, needs refills  Skin lesions are about the same as they have been, improve with the medicines  Located primarily scalp, neck  Sometimes itchy  No redness or signs of infection  No hair loss ROS: no fevers, no joint aches  Review of Systems   See HPI for ROS. All other systems reviewed and are negative.  Past medical history, surgical, family, and social history reviewed and updated in the EMR as appropriate. Objective:  BP 120/79 mmHg  Pulse 74  Temp(Src) 97.8 F (36.6 C) (Oral)  Ht 5' 1.5" (1.562 m)  Wt 139 lb (63.05 kg)  BMI 25.84 kg/m2  LMP 01/22/2015 (Exact Date) Vitals and nursing note reviewed  General: NAD Skin: posterior scalp has scaly lesions consistent with psoriasis, no hair loss.  Assessment & Plan:  See Problem List Documentation

## 2015-01-31 NOTE — Assessment & Plan Note (Signed)
Continue clobetasol solution. Switch cream to triamcinolone due to cheaper cost (paying out of pocket), will give both high and medium potency dosages and encourage to use medium potency more frequently and high when not controlled. F/u as needed.

## 2015-01-31 NOTE — Patient Instructions (Signed)
Use GoodRx website or phone app for cheaper prices.

## 2015-02-01 ENCOUNTER — Other Ambulatory Visit: Payer: Self-pay | Admitting: Family Medicine

## 2015-02-01 MED ORDER — CLOBETASOL PROPIONATE 0.05 % EX SOLN: 1.0000 "application " | Freq: Two times a day (BID) | CUTANEOUS | Status: DC

## 2016-02-07 DIAGNOSIS — Z139 Encounter for screening, unspecified: Secondary | ICD-10-CM

## 2016-02-07 LAB — GLUCOSE, POCT (MANUAL RESULT ENTRY): POC Glucose: 96 mg/dl (ref 70–99)

## 2016-02-07 NOTE — Congregational Nurse Program (Signed)
Congregational Nurse Program Note  Date of Encounter: 02/07/2016  Past Medical History: No past medical history on file.  Encounter Details:     CNP Questionnaire - 02/07/16 1452    Patient Demographics   Is this a new or existing patient? New   Patient is considered a/an Immigrant   Race Latino/Hispanic   Patient Assistance   [place] [facility] [place] [facility] [place]   Patient's financial/insurance status Orange Card/Care Connects   Uninsured Patient No   Patient referred to apply for the following financial assistance Not Applicable   Food insecurities addressed Not Applicable   Transportation assistance No   Assistance securing medications No   Educational health offerings Not Applicable   Encounter Details   Primary purpose of visit Education/Health Concerns   Was an Emergency Department visit averted? No   Does patient have a medical provider? Yes   Patient referred to Not Applicable   Was a mental health screening completed? (GAINS tool) No   Does patient have dental issues? No   Does patient have vision issues? No   Since previous encounter, have you referred patient for abnormal blood pressure that resulted in a new diagnosis or medication change? No   Since previous encounter, have you referred patient for abnormal blood glucose that resulted in a new diagnosis or medication change? No     Routine Screening BP 125/79 mmHg  Pulse [address] Debbie Muskan Bolla RN - MississippiCNP [phone]

## 2016-08-15 ENCOUNTER — Ambulatory Visit (INDEPENDENT_AMBULATORY_CARE_PROVIDER_SITE_OTHER): Payer: Self-pay | Admitting: Internal Medicine

## 2016-08-15 VITALS — BP 124/85 | HR 92 | Temp 97.5°F | Ht 61.5 in | Wt 138.8 lb

## 2016-08-15 DIAGNOSIS — L409 Psoriasis, unspecified: Secondary | ICD-10-CM

## 2016-08-15 MED ORDER — CLOBETASOL PROPIONATE 0.05 % EX SOLN: 1.0000 "application " | Freq: Two times a day (BID) | CUTANEOUS | 11 refills | Status: DC

## 2016-08-15 NOTE — Patient Instructions (Signed)
April Taylor. April Taylor,  [name], [name]  Haga una cita lo ms pronto posible para la prueba de [place].  Mejor, Dr. Sampson GoonFitzgerald   Ms. April Taylor,  You may find your psoriasis treatment more affordable at another location like [facility]. I have sent a prescription to [facility]. If still expensive there, take the printed prescription elsewhere.  Please make an appointment at your earliest convenience for pap smear.  Best, Dr. Sampson GoonFitzgerald   Psoriasis (Psoriasis) La psoriasis es una afeccin prolongada (crnica) de inflamacin en la piel. Se produce debido a que el sistema inmunitario hace que se formen clulas cutneas con mucha rapidez. En consecuencia, se desarrollan demasiadas clulas cutneas que forman manchas (placas) rojas y con relieve en la piel, que se ven plateadas. Las placas pueden aparecer en cualquier parte del cuerpo. Pueden tener cualquier tamao o forma. La psoriasis puede ir y venir. Puede ser de leve a muy grave. No puede transmitirse de [place] persona a otra (no es contagiosa).  CAUSAS  Se desconoce la causa de la psoriasis, pero determinados factores pueden empeorar la afeccin. Estos incluyen los siguientes:   Lesiones o traumatismos en la piel, como cortes, raspones, quemaduras de sol y sequedad.  Falta de sol.  Algunos medicamentos.  Alcohol.  Consumo de tabaco.  El estrs.  Infecciones causadas por bacterias o virus. FACTORES DE RIESGO Es ms probable que esta afeccin se manifieste en:  Las personas con antecedentes familiares de psoriasis.  Las Dealerpersonas de origen caucsico.  Las personas que tienen de 15 a 30aos y de 50 a 60aos. SNTOMAS  Existen cinco tipos diferentes de psoriasis. Se puede presentar ms de un tipo de psoriasis a lo largo de la vida. Estos  tipos son los siguientes:   En placas.  En gotas.  Seborreica.  Pustulosa.  Eritrodrmica. Cada tipo de psoriasis presenta sntomas diferentes.   Uno de los sntomas de la psoriasis en placas es la presencia de Franklinmachas rojas y con relieve, con una cubierta blanca plateada (escama). Estas placas pueden causar picazn. Las uas pueden estar frgiles y Panamaquebradizas, o caerse.  Uno de los sntomas de la psoriasis en gotas es la presencia de manchas rojas pequeas que a menudo aparecen en el tronco, los brazos y las piernas. Estas manchas pueden desarrollarse despus de una enfermedad, en especial, faringitis estreptoccica.  Uno de los sntomas de la psoriasis seborreica es la presencia de placas debajo de las axilas o los senos, o en los genitales, la ingle o las nalgas.  Uno de los sntomas de la psoriasis pustulosa es la presencia de los bultos llenos de pus (dolorosos, rojos e hinchados) en las palmas de las manos o las plantas de los pies. Tambin puede sentirse exhausto, afiebrado, dbil o sin apetito.  Uno de los sntomas de la psoriasis eritrodrmica es el aspecto rojo brillante en la piel, que puede parecer Harbor Bluffsquemada. Puede sentir los latidos cardacos acelerados y Press photographertener la temperatura corporal muy alta o muy baja. Quizs sienta picazn o dolor. DIAGNSTICO  El mdico puede sospechar la presencia de psoriasis en funcin de los sntomas y los antecedentes familiares. El mdico Location managertambin le realizar un examen fsico. Esto puede incluir un procedimiento para extraer y examinar Lauris Poaguna muestra de tejido (biopsia). Tambin pueden derivarlo a un mdico especialista en enfermedades de la piel (dermatlogo).  TRATAMIENTO  Esta afeccin no tiene [place], pero el tratamiento puede ayudar a Theatre manager. Entre los [employer] del [place], se incluye lo siguiente:   Ayudar a que la piel cicatrice.  Reducir la picazn y la inflamacin.  Retrasar el desarrollo de nuevas clulas cutneas.  Ayudar a que el  sistema inmunitario responda mejor y no deteriore la piel. El tratamiento vara, segn la gravedad de la afeccin. El tratamiento puede incluir lo siguiente:   Cremas o ungentos.  Exposicin a rayos ultravioleta (fototerapia). Esto puede incluir la luz natural del sol o la fototerapia en el consultorio mdico.  Medicamentos (tratamiento sistmico). Estos medicamentos pueden ayudar a que el cuerpo controle mejor el desarrollo de las clulas cutneas y la inflamacin. Pueden utilizarse junto con fototerapia o ungentos. Si tiene una infeccin, tambin puede recibir antibiticos. INSTRUCCIONES PARA EL CUIDADO EN EL HOGAR Cuidado de la piel  Humctese la piel segn sea necesario. Use solamente las cremas humectantes aprobadas por su mdico.   Aplique compresas fras en las zonas afectadas.   No se rasque la piel.  Estilo de vida  No consuma productos que contengan tabaco. Estos incluyen cigarrillos, tabaco para mascar y Administrator, Civil Service. Si necesita ayuda para dejar de fumar, consulte al mdico.  No tome alcohol, o tome poca cantidad.   Pruebe con tcnicas que reduzcan el estrs, como meditacin o yoga.  Expngase al sol como se lo haya indicado el mdico. No se broncee.   Considere participar en un grupo de apoyo para la psoriasis.  Medicamentos  Tome o use los medicamentos de venta libre y los recetados solamente como se lo haya indicado el mdico.  Si le recetaron un antibitico, tmelo o selo como se lo haya indicado el mdico. No deje de tomar el antibitico aunque la afeccin mejore. Instrucciones generales  Lleve un diario como ayuda para rastrear lo que le desencadena un brote. Intente evitar los factores desencadenantes.   Consulte a un consejero o un asistente social si se siente triste, frustrado o desesperanzado con respecto a su afeccin ya que considera que esta interfiere en su trabajo y sus relaciones.  Concurra a todas las visitas de control  como se lo haya indicado el mdico. Esto es importante. SOLICITE ATENCIN MDICA SI:  El dolor empeora.  Aumentan el enrojecimiento y Company secretary en la zona de la herida.   Comienza a Surveyor, mining o entumecimiento en las articulaciones, o estos sntomas empeoran.  Las uas comienzan a romperse fcilmente o a desprenderse del lecho ungueal.   Tiene fiebre.   Se siente deprimido.   Esta informacin no tiene Theme park manager el consejo del mdico. Asegrese de hacerle al mdico cualquier pregunta que tenga.   Document Released: 07/16/2005 Document Revised: 06/27/2015 Elsevier Interactive Patient Education [employer]

## 2016-08-15 NOTE — Progress Notes (Signed)
[facility] Progress Note  Subjective:  April Taylor is a 30 y.o. female with history of scalp psoriasis who presents for medication refill. Visit conducted with assistance of Spanish Video interpreter Kelby FamManuel [phone]).   Scalp psoriasis: - Has had symptoms since 2011 - Says was diagnosed by a dermatologist - Has tried lidex, tar shampoo and kenalog cream - Creams did not help much because hard to reach hair - Feels clobetasol helps most but is expensive - Is currently having a lot of itching since she ran out of medication ROS: Denies joint pain, denies rashes elsewhere  No Known Allergies  Objective: Blood pressure 124/85, pulse 92, temperature 97.5 F (36.4 C), temperature source Oral, height 5' 1.5" (1.562 m), weight 138 lb 12.8 oz (63 kg), last menstrual period 08/08/2016, SpO2 99 %. Body mass index is 25.8 kg/m. Constitutional: Overweight female, in NAD HENT: Mildly enlarged tonsils, otherwise normal appearing posterior oropharynx Skin: Skin is warm and dry. Plaques with skin flaking noted throughout scalp but not elsewhere on exposed skin.  Psychiatric: Normal mood and affect.  Vitals reviewed  Assessment/Plan: Psoriasis - Refilled clobetasol solution. Provided coupon through GoodRx and paper prescription for patient to shop prices with different pharmacies. Also sent Rx to [facility] per pt request, as she believes she can get the medication at discounted price with a different coupon.   Follow-up at earliest convenience to get pap smear (did not want to get today). Plans to get flu shot elsewhere due to cost.   Dani GobbleHillary Fitzgerald, MD [facility], PGY-2

## 2016-08-17 ENCOUNTER — Encounter: Payer: Self-pay | Admitting: Internal Medicine

## 2016-08-17 NOTE — Assessment & Plan Note (Signed)
-   Refilled clobetasol solution. Provided coupon through GoodRx and paper prescription for patient to shop prices with different pharmacies. Also sent Rx to [facility] per pt request, as she believes she can get the medication at discounted price with a different coupon.

## 2016-09-04 ENCOUNTER — Ambulatory Visit: Payer: Self-pay | Admitting: Family Medicine

## 2017-02-10 ENCOUNTER — Encounter (HOSPITAL_COMMUNITY): Payer: Self-pay

## 2017-02-10 ENCOUNTER — Ambulatory Visit (HOSPITAL_COMMUNITY)
Admission: RE | Admit: 2017-02-10 | Discharge: 2017-02-10 | Disposition: A | Discharge status: Home / Self Care | Payer: Self-pay | Source: Ambulatory Visit | Attending: Obstetrics and Gynecology | Admitting: Obstetrics and Gynecology

## 2017-02-10 VITALS — BP 118/76 | Ht 59.06 in | Wt 131.0 lb

## 2017-02-10 DIAGNOSIS — Z01419 Encounter for gynecological examination (general) (routine) without abnormal findings: Secondary | ICD-10-CM

## 2017-02-10 NOTE — Progress Notes (Signed)
No complaints today.   Pap Smear: Pap smear completed today. Last Pap smear was 06/01/2013 and ASCUS with negative HPV. Per patient her most recent Pap smear is the only abnormal Pap smear she has had. Last Pap smear result is in EPIC.  Physical exam: Breasts Breasts symmetrical. No skin abnormalities bilateral breasts. No nipple retraction bilateral breasts. No nipple discharge bilateral breasts. No lymphadenopathy. No lumps palpated bilateral breasts. No complaints of pain or tenderness on exam. Screening mammogram recommended at age 27 unless clinically indicated prior.  Pelvic/Bimanual   Ext Genitalia No lesions, no swelling and no discharge observed on external genitalia.         Vagina Vagina pink and normal texture. No lesions or discharge observed in vagina.          Cervix Cervix is present. Cervix pink and of normal texture. Cervix friable. No discharge observed.     Uterus Uterus is present and palpable. Uterus in normal position and normal size.        Adnexae Bilateral ovaries present and palpable. No tenderness on palpation.          Rectovaginal No rectal exam completed today since patient had no rectal complaints. No skin abnormalities observed on exam.    Smoking History: Patient has never smoked.  Patient Navigation: Patient education provided. Access to services provided for patient through [facility] program. Spanish interpreter provided.  Used Spanish interpreter [employer] from [place].

## 2017-02-10 NOTE — Patient Instructions (Signed)
Explained breast self awareness with April Taylor. Informed patient that if today's Pap smear is normal that her next Pap smear will be due in one year due to her last Pap smear was abnormal. Let patient know will follow up with her within the next couple weeks with results of Pap smear by phone. Told patient she will need a screening mammogram at age 31 unless clinically indicated prior. April Taylor verbalized understanding.  Sadhana Frater, Kathaleen Maser, RN 2:58 PM

## 2017-02-11 ENCOUNTER — Encounter (HOSPITAL_COMMUNITY): Payer: Self-pay | Admitting: *Deleted

## 2017-02-12 LAB — CYTOLOGY - PAP
Diagnosis: NEGATIVE
HPV: NOT DETECTED

## 2017-02-17 ENCOUNTER — Telehealth (HOSPITAL_COMMUNITY): Payer: Self-pay | Admitting: *Deleted

## 2017-02-17 NOTE — Telephone Encounter (Signed)
Telephoned patient at home number and advised patient of negative pap smear results. HPV was negative. Next pap smear due in one year due to history of abnormal pap smear. Used interpreter Delorise Royals.

## 2017-03-12 ENCOUNTER — Encounter: Payer: Self-pay | Admitting: Family Medicine

## 2017-03-12 ENCOUNTER — Ambulatory Visit (INDEPENDENT_AMBULATORY_CARE_PROVIDER_SITE_OTHER): Payer: Self-pay | Admitting: Family Medicine

## 2017-03-12 DIAGNOSIS — J029 Acute pharyngitis, unspecified: Secondary | ICD-10-CM

## 2017-03-12 MED ORDER — PHENOL 1.4 % MT LIQD: 1.0000 | OROMUCOSAL | 0 refills | Status: DC | PRN

## 2017-03-12 NOTE — Patient Instructions (Signed)
Dolor de garganta (Sore Throat) El dolor de garganta es dolor, ardor, irritacin o sensacin de picazn en la garganta. Cuando usted tiene Engineer, miningdolor de Advertising copywritergarganta, puede sentir molestia o dolor en la garganta cuando traga o habla. Muchas cosas pueden causar dolor de garganta, entre ellas:  Una infeccin.  Alergias estacionales.  La sequedad en el aire.  Algunos irritantes, como el humo o la polucin.  Enfermedad por reflujo gastroesofgico (ERGE).  Un tumor. Generalmente es Financial risk analystel primer signo de otra enfermedad. Un dolor de garganta puede estar acompaado de otros sntomas, como tos, estornudos, fiebre y ganglios hinchados en el cuello. La mayor parte de los dolores de garganta desaparecen sin tratamiento mdico. INSTRUCCIONES PARA EL CUIDADO EN EL World Fuel Services CorporationHOGAR  Tome los medicamentos de venta libre solamente como se lo haya indicado el mdico.  Beba suficiente lquido para Pharmacologistmantener la orina clara o de color amarillo plido.  Descanse todo lo que sea necesario.  Para ayudar a Engineer, materialsaliviar el dolor, intente lo siguiente:  Beba lquidos calientes, como caldos, infusiones o agua caliente.  Tambin puede comer o beber lquidos fros o congelados, tales como paletas de hielo congelado.  Haga grgaras con una mezcla de agua y sal 3 o 4veces al da, o cuando sea necesario. Para preparar la mezcla de agua y sal, disuelva totalmente de media a 1cucharadita de sal en 1taza de agua tibia.  Chupar caramelos duros o pastillas para la garganta.  Ponga un humidificador de vapor fro en la habitacin por la noche para humedecer el aire.  Tambin puede abrir el agua caliente de la ducha y sentarse en el bao con la puerta cerrada durante 5a3410minutos.  No consuma ningn producto que contenga tabaco, lo que incluye cigarrillos, tabaco de Theatre managermascar y Administrator, Civil Servicecigarrillos electrnicos. Si necesita ayuda para dejar de fumar, consulte al mdico. SOLICITE ATENCIN MDICA SI:  Tiene fiebre por ms de 2 a 3 das.  Tiene  fiebre o sntomas que duran (son persistentes) ms de 2 a 3 das.  El dolor de garganta no mejora en el trmino de [address] das.  Tiene fiebre y los sntomas empeoran repentinamente. SOLICITE ATENCIN MDICA DE INMEDIATO SI:  Tiene dificultad para respirar.  No puede tragar lquidos, alimentos blandos o la saliva.  Tiene ms inflamacin en la garganta o en el cuello.  Tiene nuseas o vmitos persistentes. Esta informacin no tiene Theme park managercomo fin reemplazar el consejo del mdico. Asegrese de hacerle al mdico cualquier pregunta que tenga. Document Released: 10/06/2005 Document Revised: 01/28/2016 Document Reviewed: 07/27/2015 Elsevier Interactive Patient Education  2017 [employer]

## 2017-03-12 NOTE — Progress Notes (Signed)
   Subjective:    Patient ID: April Taylor , female   DOB: [birth year] , 31 y.o..   MRN: [id number]  HPI  April Taylor is here for  Chief Complaint  Patient presents with  . Ear Pain  . Sore Throat    *[employer] Spanish interpreter Gordy Councilmanlexandra [phone] used *  1. SORE THROAT  Sore throat began 7 days ago. Then developed some pain in her right ear for the last 3 days.  Pain is: described as scratchy Medications tried: None Strep throat exposure: No STD exposure: No  Symptoms Fever: No Cough: No Runny nose: No Muscle aches: No Swollen Glands: yes Trouble breathing: No Drooling: No Weight loss: No  Patient believes could be caused by: unsure  Review of Symptoms - see HPI PMH - Smoking status noted.    Past Medical History: Patient Active Problem List   Diagnosis Date Noted  . Sore throat 03/12/2017  . Contraception management 01/05/2015  . Herpes labialis 01/12/2013  . Menometrorrhagia 01/19/2012  . Psoriasis 06/09/2011    Social Hx:  reports that she has never smoked. She has never used smokeless tobacco.   Objective:   BP (!) 140/98   Pulse (!) 107   Temp 97.5 F (36.4 C) (Oral)   Ht 5' 1.5" (1.562 m)   Wt 135 lb 12.8 oz (61.6 kg)   LMP 02/19/2017   SpO2 96%   BMI 25.24 kg/m  Physical Exam  Gen: NAD, alert, cooperative with exam, well-appearing HEENT:     Head: Normocephalic, atraumatic    Neck: No masses palpated. No goiter. Bilateral anterior cervical lymphadenopathy    Ears: External ears normal, no drainage.Tympanic membranes intact, normal light reflex bilaterally, no erythema or bulging    Eyes: PERRLA, EOMI, sclera white, normal conjunctiva    Nose: nasal turbinates moist, clear nasal discharge bilaterally with swollen turbinates    Throat: moist mucus membranes, no pharyngeal erythema, mildly enlarged tonsils, no tonsillar exudate. Airway is patent Cardiac: Regular rate and rhythm, normal S1/S2, no murmur, no edema,  capillary refill brisk  Respiratory: Clear to auscultation bilaterally, no wheezes, non-labored breathing Gastrointestinal: soft, non tender, non distended, bowel sounds present, no hepatosplenomegaly Skin: no rashes, normal turgor    Assessment & Plan:  Sore throat Likely viral URI. Afebrile. No signs of sinusitis, otitis media, or pneumonia. The plan is as follows -Symptomatic therapy suggested: push fluids, rest, warm tea with honey, use acetaminophen/ibuprofen prn  -Return precautions discussed, return office visit if symptoms persist or worsen.  -Rx for Chloraseptic spray given - If patient continues to have the same symptoms and is afebrile, could be related to seasonal allergies    Meds ordered this encounter  Medications  . phenol (CHLORASEPTIC) 1.4 % LIQD    Sig: Use as directed 1 spray in the mouth or throat as needed for throat irritation / pain.    Dispense:  1 Bottle    Refill:  0    Anders Simmondshristina Gambino, MD [facility], PGY-2

## 2017-03-12 NOTE — Assessment & Plan Note (Addendum)
Likely viral URI. Afebrile. No signs of sinusitis, otitis media, or pneumonia. The plan is as follows -Symptomatic therapy suggested: push fluids, rest, warm tea with honey, use acetaminophen/ibuprofen prn  -Return precautions discussed, return office visit if symptoms persist or worsen.  -Rx for Chloraseptic spray given - If patient continues to have the same symptoms and is afebrile, could be related to seasonal allergies

## 2017-03-19 ENCOUNTER — Ambulatory Visit: Payer: Self-pay | Admitting: Internal Medicine

## 2017-12-15 ENCOUNTER — Other Ambulatory Visit: Payer: Self-pay | Admitting: Internal Medicine

## 2017-12-15 DIAGNOSIS — L409 Psoriasis, unspecified: Secondary | ICD-10-CM

## 2017-12-15 MED ORDER — CLOBETASOL PROPIONATE 0.05 % EX SOLN: 1.0000 "application " | Freq: Two times a day (BID) | CUTANEOUS | 11 refills | Status: DC

## 2017-12-15 NOTE — Telephone Encounter (Signed)
Patient came to office requesting refill on RX Clobetasol. [facility]. Please let patient know when done.  [phone]

## 2018-02-16 ENCOUNTER — Ambulatory Visit (HOSPITAL_COMMUNITY)
Admission: RE | Admit: 2018-02-16 | Discharge: 2018-02-16 | Disposition: A | Discharge status: Home / Self Care | Payer: Self-pay | Source: Ambulatory Visit | Attending: Obstetrics and Gynecology | Admitting: Obstetrics and Gynecology

## 2018-02-16 ENCOUNTER — Encounter (HOSPITAL_COMMUNITY): Payer: Self-pay

## 2018-02-16 VITALS — BP 118/72 | Ht 63.0 in | Wt 137.0 lb

## 2018-02-16 DIAGNOSIS — Z01419 Encounter for gynecological examination (general) (routine) without abnormal findings: Secondary | ICD-10-CM

## 2018-02-16 NOTE — Patient Instructions (Signed)
Explained breast self awareness with Yasha Taylor. Let patient know that if today's Pap smear is normal that her next Pap smear will be due in one year due to her recent history of an abnormal Pap smear. Let patient know will follow up with her within the next couple weeks with results of Pap smear by letter or phone. Informed patient that she will need a screening mammogram at age 32 unless clinically indicated prior. April Taylor verbalized understanding.  Charma Mocarski, Kathaleen Maser, RN 10:10 AM

## 2018-02-16 NOTE — Progress Notes (Signed)
No complaints today.   Pap Smear: Pap smear completed today. Last Pap smear was 02/14/2017 at [facility] and normal with negative HPV. Patient has a history of an abnormal Pap smear 06/01/2013 that was ASCUS with negative HPV. Last three Pap smear results are in Epic.  Physical exam: Breasts Breasts symmetrical. No skin abnormalities bilateral breasts. No nipple retraction bilateral breasts. No nipple discharge bilateral breasts. No lymphadenopathy. No lumps palpated bilateral breasts. No complaints of pain or tenderness on exam. Screening mammogram recommended at age 30 unless clinically indicated prior.  Pelvic/Bimanual   Ext Genitalia No lesions, no swelling and no discharge observed on external genitalia.         Vagina Vagina pink and normal texture. No lesions or discharge observed in vagina.          Cervix Cervix is present. Cervix reddened and of normal texture. No discharge observed.     Uterus Uterus is present and palpable. Uterus in normal position and normal size.        Adnexae Bilateral ovaries present and palpable. No tenderness on palpation.         Rectovaginal No rectal exam completed today since patient had no rectal complaints. No skin abnormalities observed on exam.    Smoking History: Patient has never smoked.  Patient Navigation: Patient education provided. Access to services provided for patient through [facility] program. Spanish interpreter provided.   Breast and Cervical Cancer Risk Assessment: Patient has no family history of breast cancer, known genetic mutations, or radiation treatment to the chest before age 77. Patient has no history of cervical dysplasia, immunocompromised, or DES exposure in-utero.  Used Spanish interpreter [employer] from [place].No complaints today.

## 2018-02-17 ENCOUNTER — Encounter (HOSPITAL_COMMUNITY): Payer: Self-pay | Admitting: *Deleted

## 2018-02-18 LAB — CYTOLOGY - PAP
Diagnosis: NEGATIVE
HPV: NOT DETECTED

## 2018-04-28 ENCOUNTER — Encounter (HOSPITAL_COMMUNITY): Payer: Self-pay | Admitting: *Deleted

## 2018-04-28 NOTE — Progress Notes (Signed)
Letter mailed to patient with negative pap smear results. HPV was negative. Next pap smear due in one year.  

## 2018-04-30 LAB — GLUCOSE, POCT (MANUAL RESULT ENTRY): POC Glucose: 143 mg/dl — AB (ref 70–99)

## 2018-05-22 ENCOUNTER — Encounter (HOSPITAL_COMMUNITY): Payer: Self-pay | Admitting: Emergency Medicine

## 2018-05-22 ENCOUNTER — Ambulatory Visit (HOSPITAL_COMMUNITY)
Admission: EM | Admit: 2018-05-22 | Discharge: 2018-05-22 | Disposition: A | Discharge status: Home / Self Care | Payer: Self-pay | Attending: Family Medicine | Admitting: Family Medicine

## 2018-05-22 DIAGNOSIS — H66001 Acute suppurative otitis media without spontaneous rupture of ear drum, right ear: Secondary | ICD-10-CM

## 2018-05-22 DIAGNOSIS — H6123 Impacted cerumen, bilateral: Secondary | ICD-10-CM

## 2018-05-22 MED ORDER — AMOXICILLIN 500 MG PO CAPS: 500.0000 mg | ORAL_CAPSULE | Freq: Two times a day (BID) | ORAL | 0 refills | Status: AC

## 2018-05-22 NOTE — ED Provider Notes (Signed)
[facility] CARE CENTER   [id number] 05/22/18 Arrival Time: 1003  SUBJECTIVE: History obtained from interpretor De Nurse #[id number]  April Taylor is a 32 y.o. female who presents with of right ear pain for the past 1 week.  Denies a precipitating event, such as trauma, swimming or wearing ear plugs.  Patient states the pain is intermittent and dull in character.  Patient has not tried OTC medications.  Denies aggravating factors.  Reports similar symptoms in the past that improved without treatment.    Complains of associated HA, and right watery otorrhea.  Denies fever, chills, fatigue, sinus pain, rhinorrhea, sore throat, chest pain, nausea, changes in bowel or bladder habits.    ROS: As per HPI.  History reviewed. No pertinent past medical history. History reviewed. No pertinent surgical history. No Known Allergies No current facility-administered medications on file prior to encounter.    No current outpatient medications on file prior to encounter.   Social History   Socioeconomic History  . Marital status: Single    Spouse name: Not on file  . Number of children: Not on file  . Years of education: 30  . Highest education level: Not on file  Occupational History  . Not on file  Social Needs  . Financial resource strain: Not on file  . Food insecurity:    Worry: Not on file    Inability: Not on file  . Transportation needs:    Medical: Not on file    Non-medical: Not on file  Tobacco Use  . Smoking status: Never Smoker  . Smokeless tobacco: Never Used  Substance and Sexual Activity  . Alcohol use: No  . Drug use: No  . Sexual activity: Yes    Birth control/protection: None  Lifestyle  . Physical activity:    Days per week: Not on file    Minutes per session: Not on file  . Stress: Not on file  Relationships  . Social connections:    Talks on phone: Not on file    Gets together: Not on file    Attends religious service: Not on file    Active member of club  or organization: Not on file    Attends meetings of clubs or organizations: Not on file    Relationship status: Not on file  . Intimate partner violence:    Fear of current or ex partner: Not on file    Emotionally abused: Not on file    Physically abused: Not on file    Forced sexual activity: Not on file  Other Topics Concern  . Not on file  Social History Narrative   From [place], [place], [place].  In [place] since 2006.  Has 9th grade education   Family History  Problem Relation Age of Onset  . Osteoarthritis Mother   . Hypertension Son     OBJECTIVE:  Vitals:   05/22/18 1020  BP: 132/65  Pulse: 75  Resp: 18  Temp: 98 F (36.7 C)  SpO2: 98%     General appearance: alert; appears fatigued HEENT: Ears: EACs cerumen impaction bilaterally.  LT TM pearly gray with visible cone of light, without erythema; RT TM with cloudy fluid behind the TM with mild injection; Eyes: PERRL, EOMI grossly; Nose: no clear rhinorrhea; Throat: oropharynx clear, tonsils not enlarged or erythematous Neck: supple without LAD Lungs: CTAB Heart: regular rate and rhythm.  Radial pulses 2+ symmetrical bilaterally Skin: warm and dry Psychological: alert and cooperative; normal mood and affect  PROCEDURE: Consent granted.  Bilateral ear lavages performed by RN.  TMs visualized.  PT tolerated procedure well.    ASSESSMENT & PLAN:  1. Non-recurrent acute suppurative otitis media of right ear without spontaneous rupture of tympanic membrane   2. Bilateral impacted cerumen     Meds ordered this encounter  Medications  . amoxicillin (AMOXIL) 500 MG capsule    Sig: Take 1 capsule (500 mg total) by mouth 2 (two) times daily for 10 days.    Dispense:  20 capsule    Refill:  0    Order Specific Question:   Supervising Provider    Answer:   Isa RankinMURRAY, LAURA WILSON [[id number]]   Ear lavage Rest and drink plenty of fluids Prescribed amoxicillin.   Take medications as directed and to completion Continue to use  OTC ibuprofen and/ or tylenol as needed for pain control Follow up with PCP if symptoms persists Return here or go to the ER if you have any new or worsening symptoms   Reviewed expectations re: course of current medical issues. Questions answered. Outlined signs and symptoms indicating need for more acute intervention. Patient verbalized understanding. After Visit Summary given.         Rennis HardingWurst, Adelin Ventrella, PA-C 05/22/18 1113

## 2018-05-22 NOTE — Discharge Instructions (Addendum)
Ear lavage Rest and drink plenty of fluids Prescribed amoxicillin.   Take medications as directed and to completion Continue to use OTC ibuprofen and/ or tylenol as needed for pain control Follow up with PCP if symptoms persists Return here or go to the ER if you have any new or worsening symptoms   Lavado de odos Descanse y beba abundantes lquidos Amoxicilina prescrita.   Tome los medicamentos segn las instrucciones y Teacher, adult educationhasta la finalizacin Contine usando ibuprofeno OTC y/o tylenol segn sea necesario para el control del dolor Seguimiento con PCP si los sntomas persisten Regrese aqu o vaya a Urgencias si tiene sntomas nuevos o que empeoran

## 2018-05-22 NOTE — ED Triage Notes (Signed)
Pt c/o R ear pain x1 week.  

## 2018-09-01 ENCOUNTER — Ambulatory Visit (INDEPENDENT_AMBULATORY_CARE_PROVIDER_SITE_OTHER): Payer: Self-pay | Admitting: Family Medicine

## 2018-09-01 ENCOUNTER — Other Ambulatory Visit: Payer: Self-pay

## 2018-09-01 VITALS — BP 120/82 | HR 77 | Temp 98.7°F | Ht 63.0 in | Wt 138.0 lb

## 2018-09-01 DIAGNOSIS — R29898 Other symptoms and signs involving the musculoskeletal system: Secondary | ICD-10-CM | POA: Insufficient documentation

## 2018-09-01 NOTE — Progress Notes (Signed)
    Subjective:  April Taylor is a 32 y.o. female who [facility] with a chief complaint of headache and ear pain. History taken via spanish video interpreter  HPI:  Having headache and ear pain, similar to last time when she needed to have her ears washed out with subsequent improvement of her symptoms. States the ear pain is mostly on the R side but sometimes bilateral. Does hav clicking sensation in her jaw. Worse with opening and closing her mouth. No recent illness. No fever, congestion, sore throat. No hearing loss or tinnitus. No ear drainage.  ROS: Per HPI   Objective:  Physical Exam: BP 120/82   Pulse 77   Temp 98.7 F (37.1 C) (Oral)   Ht 5\' 3"  (1.6 m)   Wt 138 lb (62.6 kg)   SpO2 99%   BMI 24.45 kg/m   Gen: NAD, resting comfortably HEENT: [place], AT. R TMJ with clicking and slight subluxation with opening and closing of moth. Teeth with good dentition. TMs pearly with good light reflex bilaterally. Bilateral ear canals healthy. No tragus or mastoid tenderness.  Pulm: NWOB Skin: warm, dry Neuro: grossly normal, moves all extremities Psych: Normal affect and thought content   Assessment/Plan:  TMJ click Patient with chronic headache and jaw pain likely due to R TMJ subluxation. Advised use of a mouthguard at night and ibuprofen for symptomatic management during acute flare. Patient voiced good understanding.   Leland HerElsia J Coren Sagan, DO PGY-3, [facility] 09/01/2018 1:41 PM

## 2018-09-01 NOTE — Patient Instructions (Signed)
Sndrome de la articulacin temporomandibular (Temporomandibular Joint Syndrome) El sndrome de Sports coach (ATM) afecta las articulaciones que se encuentran entre la [place] y el crneo. Las articulaciones temporomandibulares estn ubicadas cerca de las orejas y permiten que la [place] se abra y se cierre. Estas articulaciones y los msculos adyacentes intervienen en todos los movimientos de la [place]. Las Engineer, manufacturing con sndrome de la articulacin temporomandibular tienen dolor en la zona de estas articulaciones y [employer]. Masticar, morder u Field seismologist otros movimientos con la mandbula puede ser difcil o doloroso. Las causas de este sndrome pueden ser diversas. En muchos casos, la afeccin es leve y desaparece en el trmino de unas pocas semanas. En algunas personas, la afeccin puede convertirse en un problema prolongado. CAUSAS Las causas posibles del sndrome de la articulacin temporomandibular incluyen lo siguiente:  Licensed conveyancer (bruxismo) o Engineer, maintenance (IT). Algunas personas lo hacen cuando estn bajo estrs.  Artritis.  Lesin mandibular.  Lesin en la cabeza o el cuello.  Piezas dentales o dentaduras postizas que no estn bien alineadas. En algunos casos, es posible que la causa de este sndrome no se conozca. [place] sntoma ms comn es un dolor continuo al costado de la cabeza en la zona de la articulacin temporomandibular. Otros sntomas pueden ser los siguientes:  Dolor al mover la [place], por ejemplo, al [employer] Net o morder.  Imposibilidad de abrir [employer].  Producir un chasquido al abrir la boca.  Dolor de [place].  Dolor de odos.  Dolor en el cuello o el hombro. DIAGNSTICO Generalmente, se puede hacer el diagnstico en funcin de los sntomas, la historia clnica y un examen fsico. El mdico puede revisar el rango de movimiento de la [place]. A veces se realizan estudios de diagnstico  por imgenes, como radiografas o resonancias magnticas (RM). Tal vez deba consultar al dentista para que determine si las piezas dentales y la mandbula estn alineadas correctamente. TRATAMIENTO El sndrome de la articulacin temporomandibular suele desaparecer solo. Si es Arts development officer, las opciones pueden incluir lo siguiente:  Consumir alimentos blandos y Midwife hielo o Freight forwarder.  Medicamentos para Best boy o la inflamacin.  Medicamentos para Scientist, research (life sciences).  Una placa oclusal, una placa de mordida o una boquilla para evitar que se rechinen los dientes o se aprieten las [place].  Tcnicas de relajacin o psicoterapia para ayudar a Software engineer.  Neuroestimulacin elctrica transcutnea (NET). Esto ayuda a Best boy al aplicar una corriente elctrica a travs de la piel.  Acupuntura. A veces, esta opcin ayuda a Best boy.  Ciruga de mandbula que debe realizarse en contadas ocasiones. [place] los medicamentos solamente como se lo haya indicado el mdico.  Consuma una dieta blanda si tiene dificultades para Engineer, manufacturing systems.  Aplique hielo sobre la zona dolorida. ? Ponga el hielo en una bolsa plstica. ? Coloque una [employer] piel y la bolsa de hielo. ? Coloque el hielo durante 20 minutos, 2 a 3 veces por da.  Aplique una compresa tibia sobre la zona dolorida como se lo hayan indicado.  Hgase masajes en la zona de la mandbula y haga ejercicios de estiramiento de la mandbula como se lo haya recomendado el mdico.  Si le indicaron que use una boquilla o una placa de mordida, hgalo como se lo indicaron.  No consuma los alimentos que Energy manager. No mastique goma de Higher education careers adviser.  Concurra a [place]  de control como se lo haya indicado el mdico. Esto es importante. SOLICITE ATENCIN MDICA SI:  Tiene problemas para comer.  Tiene sntomas nuevos o estos  empeoran. SOLICITE ATENCIN MDICA DE INMEDIATO SI:  Se le queda trabada la mandbula abierta o cerrada. Esta informacin no tiene Marine scientist el consejo del mdico. Asegrese de hacerle al mdico cualquier pregunta que tenga. Document Released: 07/16/2005 Document Revised: 10/27/2014 Document Reviewed: 05/11/2014 Elsevier Interactive Patient Education  [employer].

## 2018-09-01 NOTE — Assessment & Plan Note (Signed)
Patient with chronic headache and jaw pain likely due to R TMJ subluxation. Advised use of a mouthguard at night and ibuprofen for symptomatic management during acute flare. Patient voiced good understanding.

## 2018-11-12 ENCOUNTER — Encounter (HOSPITAL_COMMUNITY): Payer: Self-pay | Admitting: Emergency Medicine

## 2018-11-12 ENCOUNTER — Ambulatory Visit (HOSPITAL_COMMUNITY)
Admission: EM | Admit: 2018-11-12 | Discharge: 2018-11-12 | Disposition: A | Discharge status: Home / Self Care | Payer: Self-pay | Attending: Family Medicine | Admitting: Family Medicine

## 2018-11-12 DIAGNOSIS — J111 Influenza due to unidentified influenza virus with other respiratory manifestations: Secondary | ICD-10-CM

## 2018-11-12 DIAGNOSIS — R69 Illness, unspecified: Secondary | ICD-10-CM | POA: Insufficient documentation

## 2018-11-12 MED ORDER — BENZONATATE 100 MG PO CAPS: 100.0000 mg | ORAL_CAPSULE | Freq: Three times a day (TID) | ORAL | 0 refills | Status: DC

## 2018-11-12 MED ORDER — IBUPROFEN 600 MG PO TABS: 600.0000 mg | ORAL_TABLET | Freq: Four times a day (QID) | ORAL | 0 refills | Status: DC | PRN

## 2018-11-12 MED ORDER — CETIRIZINE HCL 10 MG PO CAPS: 10.0000 mg | ORAL_CAPSULE | Freq: Every day | ORAL | 0 refills | Status: DC

## 2018-11-12 MED ORDER — FLUTICASONE PROPIONATE 50 MCG/ACT NA SUSP: 1.0000 | Freq: Every day | NASAL | 0 refills | Status: DC

## 2018-11-12 NOTE — Discharge Instructions (Signed)
You likely having a viral upper respiratory infection. We recommended symptom control. I expect your symptoms to start improving in the next 1-2 weeks.   1. Take a daily allergy pill/anti-histamine like Zyrtec, Claritin, or Store brand consistently for 2 weeks- toma cetirizine diaria- puede obtener sin receta   2. For congestion you may try an oral decongestant like Mucinex or sudafed. You may also try intranasal flonase nasal spray or saline irrigations (neti pot, sinus cleanse)  3. For your sore throat you may try cepacol lozenges, salt water gargles, throat spray. Treatment of congestion may also help your sore throat.- dolor de garganta  4. For cough you may try Robitussen, Delsym, Mucinex DM- Tos  5. Take Tylenol or Ibuprofen to help with pain/inflammation  6. Stay hydrated, drink plenty of fluids to keep throat coated and less irritated- beber mucha agua  Honey Tea For cough/sore throat try using a honey-based tea. Use 3 teaspoons of honey with juice squeezed from half lemon. Place shaved pieces of ginger into 1/2-1 cup of water and warm over stove top. Then mix the ingredients and repeat every 4 hours as needed.

## 2018-11-12 NOTE — ED Triage Notes (Signed)
PT C/O: cold like sx onset 3 days associated w/dry cough, chest muscle pain, nasal drainage, watery eyes, fevers  DENIES: vomiting, diarrhea  TAKING MEDS: OTC nyquil  A&O x4... NAD... Ambulatory

## 2018-11-12 NOTE — ED Provider Notes (Signed)
[facility]    CSN: [id number] Arrival date & time: 11/12/18  1256     History   Chief Complaint Chief Complaint  Patient presents with  . URI    HPI Spanish interpretation via [employer] interpreter April Taylor is a 33 y.o. female no significant past medical history presenting today for evaluation of URI symptoms.  Patient states that over the past 3 days she has had cough, congestion, watery eyes, sore throat and discomfort in her chest and back.  She is also noted low-grade fevers.  Noting temperatures of 99.  She has also had associated headache.  Denies any associated nausea vomiting or diarrhea.  She has tried some NyQuil without relief.  Denies history of smoking.  Notes decreased appetite and poor oral intake.  HPI  History reviewed. No pertinent past medical history.  Patient Active Problem List   Diagnosis Date Noted  . TMJ click 09/01/2018  . Contraception management 01/05/2015  . Herpes labialis 01/12/2013  . Menometrorrhagia 01/19/2012  . Psoriasis 06/09/2011    History reviewed. No pertinent surgical history.  OB History    Gravida  2   Para  2   Term  2   Preterm  0   AB  0   Living  2     SAB  0   TAB  0   Ectopic  0   Multiple  0   Live Births  2            Home Medications    Prior to Admission medications   Medication Sig Start Date End Date Taking? Authorizing Provider  benzonatate (TESSALON) 100 MG capsule Take 1 capsule (100 mg total) by mouth every 8 (eight) hours. 11/12/18   Eara Burruel C, PA-C  Cetirizine HCl 10 MG CAPS Take 1 capsule (10 mg total) by mouth daily for 10 days. 11/12/18 11/22/18  Sarya Linenberger C, PA-C  fluticasone (FLONASE) 50 MCG/ACT nasal spray Place 1-2 sprays into both nostrils daily for 7 days. 11/12/18 11/19/18  Elis Rawlinson C, PA-C  ibuprofen (ADVIL,MOTRIN) 600 MG tablet Take 1 tablet (600 mg total) by mouth every 6 (six) hours as needed. 11/12/18   Lyall Faciane, Junius Creamer, PA-C     Family History Family History  Problem Relation Age of Onset  . Osteoarthritis Mother   . Hypertension Son     Social History Social History   Tobacco Use  . Smoking status: Never Smoker  . Smokeless tobacco: Never Used  Substance Use Topics  . Alcohol use: No  . Drug use: No     Allergies   Patient has no known allergies.   Review of Systems Review of Systems  Constitutional: Positive for chills, fatigue and fever. Negative for activity change and appetite change.  HENT: Positive for congestion, rhinorrhea and sore throat. Negative for ear pain, sinus pressure and trouble swallowing.   Eyes: Negative for discharge and redness.  Respiratory: Positive for cough. Negative for chest tightness and shortness of breath.   Cardiovascular: Negative for chest pain.  Gastrointestinal: Negative for abdominal pain, diarrhea, nausea and vomiting.  Musculoskeletal: Negative for myalgias.  Skin: Negative for rash.  Neurological: Positive for headaches. Negative for dizziness and light-headedness.     Physical Exam Triage Vital Signs ED Triage Vitals  Enc Vitals Group     BP 11/12/18 1349 131/86     Pulse Rate 11/12/18 1349 (!) 134     Resp 11/12/18 1349 18     Temp 11/12/18  1349 99.2 F (37.3 C)     Temp Source 11/12/18 1349 Tympanic     SpO2 11/12/18 1349 99 %     Weight --      Height --      Head Circumference --      Peak Flow --      Pain Score 11/12/18 1347 6     Pain Loc --      Pain Edu? --      Excl. in GC? --    No data found.  Updated Vital Signs BP 131/86 (BP Location: Left Arm)   Pulse (!) 134   Temp 99.2 F (37.3 C) (Tympanic)   Resp 18   LMP 10/18/2018   SpO2 99%  Heart rate rechecked and was 117. Visual Acuity Right Eye Distance:   Left Eye Distance:   Bilateral Distance:    Right Eye Near:   Left Eye Near:    Bilateral Near:     Physical Exam Vitals signs and nursing note reviewed.  Constitutional:      General: She is not in  acute distress.    Appearance: She is well-developed.     Comments: Sitting comfortably on exam table  HENT:     Head: Normocephalic and atraumatic.     Ears:     Comments: Bilateral ears without tenderness to palpation of external auricle, tragus and mastoid, EAC's without erythema or swelling, TM's with good bony landmarks and cone of light. Non erythematous.    Nose:     Comments: Nasal mucosa erythematous    Mouth/Throat:     Comments: Oral mucosa pink and moist, no tonsillar enlargement or exudate. Posterior pharynx patent and nonerythematous, no uvula deviation or swelling. Normal phonation.  Eyes:     Conjunctiva/sclera: Conjunctivae normal.  Neck:     Musculoskeletal: Neck supple.  Cardiovascular:     Rate and Rhythm: Regular rhythm. Tachycardia present.     Heart sounds: No murmur.  Pulmonary:     Effort: Pulmonary effort is normal. No respiratory distress.     Breath sounds: Normal breath sounds.     Comments: Breathing comfortably at rest, CTABL, no wheezing, rales or other adventitious sounds auscultated Abdominal:     Palpations: Abdomen is soft.     Tenderness: There is no abdominal tenderness.  Musculoskeletal:     Comments: No lower extremity swelling, erythema or tenderness  Skin:    General: Skin is warm and dry.  Neurological:     Mental Status: She is alert.      UC Treatments / Results  Labs (all labs ordered are listed, but only abnormal results are displayed) Labs Reviewed - No data to display  EKG None  Radiology No results found.  Procedures Procedures (including critical care time)  Medications Ordered in UC Medications - No data to display  Initial Impression / Assessment and Plan / UC Course  I have reviewed the triage vital signs and the nursing notes.  Pertinent labs & imaging results that were available during my care of the patient were reviewed by me and considered in my medical decision making (see chart for  details).  Clinical Course as of Nov 12 2100  Fri Nov 12, 2018  1449 HR rechecked 117   [HW]    Clinical Course User Index [HW] Natividad Halls, JohnsonHallie C, [place]PA-C    Patient with URI symptoms x3 days.  Patient is significantly tachycardic compared to what would be expected with some mild dehydration from  decreased appetite.  Do not suspect underlying DVT or PE at this time.  Lungs clear, do not suspect pneumonia.  Will treat symptomatically and supportively at this time with close monitoring.  Discussed with patient to monitor her heart rate and follow-up if heart rate not resolving or symptoms persisting.Discussed strict return precautions. Patient verbalized understanding and is agreeable with plan.  Final Clinical Impressions(s) / UC Diagnoses   Final diagnoses:  Influenza-like illness     Discharge Instructions     You likely having a viral upper respiratory infection. We recommended symptom control. I expect your symptoms to start improving in the next 1-2 weeks.   1. Take a daily allergy pill/anti-histamine like Zyrtec, Claritin, or Store brand consistently for 2 weeks- toma cetirizine diaria- puede obtener sin receta   2. For congestion you may try an oral decongestant like Mucinex or sudafed. You may also try intranasal flonase nasal spray or saline irrigations (neti pot, sinus cleanse)  3. For your sore throat you may try cepacol lozenges, salt water gargles, throat spray. Treatment of congestion may also help your sore throat.- dolor de garganta  4. For cough you may try Robitussen, Delsym, Mucinex DM- Tos  5. Take Tylenol or Ibuprofen to help with pain/inflammation  6. Stay hydrated, drink plenty of fluids to keep throat coated and less irritated- beber mucha agua  Honey Tea For cough/sore throat try using a honey-based tea. Use 3 teaspoons of honey with juice squeezed from half lemon. Place shaved pieces of ginger into 1/2-1 cup of water and warm over stove top. Then mix the  ingredients and repeat every 4 hours as needed.   ED Prescriptions    Medication Sig Dispense Auth. Provider   ibuprofen (ADVIL,MOTRIN) 600 MG tablet Take 1 tablet (600 mg total) by mouth every 6 (six) hours as needed. 30 tablet Arnett Galindez C, PA-C   fluticasone (FLONASE) 50 MCG/ACT nasal spray Place 1-2 sprays into both nostrils daily for 7 days. 1 g Drequan Ironside C, PA-C   Cetirizine HCl 10 MG CAPS Take 1 capsule (10 mg total) by mouth daily for 10 days. 10 capsule Wylodean Shimmel C, PA-C   benzonatate (TESSALON) 100 MG capsule Take 1 capsule (100 mg total) by mouth every 8 (eight) hours. 21 capsule Dock Baccam C, PA-C     Controlled Substance Prescriptions [place] Controlled Substance Registry consulted? Not Applicable   Lew Dawes, [place] 11/12/18 2105

## 2018-12-06 ENCOUNTER — Ambulatory Visit (INDEPENDENT_AMBULATORY_CARE_PROVIDER_SITE_OTHER): Payer: Self-pay | Admitting: Family Medicine

## 2018-12-06 ENCOUNTER — Other Ambulatory Visit: Payer: Self-pay

## 2018-12-06 VITALS — BP 122/80 | HR 81 | Temp 98.8°F | Ht 63.0 in | Wt 135.6 lb

## 2018-12-06 DIAGNOSIS — K219 Gastro-esophageal reflux disease without esophagitis: Secondary | ICD-10-CM | POA: Insufficient documentation

## 2018-12-06 MED ORDER — PANTOPRAZOLE SODIUM 20 MG PO TBEC: 20.0000 mg | DELAYED_RELEASE_TABLET | Freq: Every day | ORAL | 0 refills | Status: DC

## 2018-12-06 NOTE — Assessment & Plan Note (Signed)
Acute.  No red flags.  Prior history of reflux. - Discussed conservative management and given formation on foods to avoid - Advised to try Pepcid over-the-counter as needed and given 2-week prescription for Protonix - Reviewed return precautions, RTC as needed

## 2018-12-06 NOTE — Patient Instructions (Signed)
Gracias por venir a vernos hoy. Consulte a continuacin para revisar nuestro plan para la visita de hoy.  Los sntomas son consistentes con el reflujo cido.  Es Manufacturing engineer comer comidas pesadas al menos 2-3 horas antes de acostarse a dormir.  Puse algo de informacin a continuacin sobre este problema mdico crnico.  Puedes probar Pepcid de venta libre para aliviar los sntomas rpidamente y tomarlos segn sea necesario.  Te di una receta de [employer] para Protonix que ayudar a [employer], sin embargo esto no es un medicamento a Air cabin crew.  Llame a la clnica al [phone] si sus sntomas empeoran o si tiene alguna inquietud. Fue Regulatory affairs officer.  Durward Parcel, DO [facility] [place], PGY-3  Thank you for coming in to see [place] today. Please see below to review our plan for today's visit.  Your symptoms are consistent with acid reflux.  It is best to avoid eating heavy meals at least 2-3 hours prior to lying down to sleep.  I put some information below about this chronic medical problem.  You can try Pepcid over-the-counter to alleviate your symptoms quickly and take as needed.  I did give you a two-week prescription for Protonix which will help alleviate symptoms, however this is not a long-term medication.  Please call the clinic at [phone] if your symptoms worsen or you have any concerns. It was our pleasure to serve you.  Durward Parcel, DO  Family Medicine, PGY-3    Opciones de alimentos para pacientes adultos con enfermedad de reflujo gastroesofgico [employer] for Gastroesophageal Reflux Disease, Adult Si tiene enfermedad de reflujo gastroesofgico (ERGE), los alimentos que consume y los hbitos de alimentacin son Engineer, production. Elegir los alimentos adecuados puede ayudar a [employer]. Piense en consultar a un especialista en nutricin (nutricionista) para que lo ayude a Associate Professor. Consejos para  seguir [employer] plan  Comidas  Elija alimentos saludables con bajo contenido de grasa, como frutas, verduras, cereales integrales, productos lcteos descremados y carne [place] de [place], de pescado y de [place].  Haga comidas pequeas durante Glass blower/designer de 3 comidas abundantes. Coma lentamente y en un lugar donde est distendido. Evite agacharse o recostarse hasta 2 o 3horas despus de haber comido.  Evite comer 2 a 3horas antes de ir a acostarse.  Evite beber grandes cantidades de lquidos con las comidas.  Evite frer los alimentos a la hora de la coccin. Puede hornear, grillar o asar a la parrilla.  Evite o limite la cantidad de: ? Chocolate. ? Menta y mentol. ? Alcohol. ? Pimienta. ? Caf negro y descafeinado. ? T negro y descafeinado. ? Bebidas con gas (gaseosas). ? Bebidas energizantes y refrescos que contengan cafena.  Limite los alimentos con alto contenido de [place], por ejemplo: ? Carnes grasas o alimentos fritos. ? Leche entera, crema, manteca o helado. ? Nueces y [place] de frutos secos. ? Pastelera, donas y dulces hechos con [place] o [place].  Evite los alimentos que le ocasionen sntomas. Estos pueden ser distintos para Advertising account planner. Los alimentos que suelen causan sntomas son los siguientes: ? Haematologist. ? Naranjas, limones y limas. ? Pimientos. ? Comidas condimentadas. ? [place] y Kae Heller. ? [place]. Estilo de vida  Mantenga un peso saludable. Pregntele a su mdico cul es el peso saludable para usted. Si necesita perder peso, hable con su mdico para hacerlo de manera segura.  Realice actividad fsica durante, al menos, 30 minutos 5 das por  semana o ms, o segn lo indicado por su mdico.  Use ropa suelta.  No fume. Si necesita ayuda para dejar de fumar, consulte al mdico.  Duerma con la cabecera de la cama ms elevada que los pies. Use una cua debajo del colchn o bloques debajo del armazn de la cama para Pharmacologist la cabecera de la cama  elevada. Resumen  Si tiene enfermedad de reflujo gastroesofgico (ERGE), las elecciones de alimentos y el [place] de vida son muy importantes para ayudar a Paramedic los sntomas.  Haga comidas pequeas durante Glass blower/designer de 3 comidas abundantes. Coma lentamente y en un lugar donde est distendido.  Limite los alimentos con alto contenido graso como la carne grasa o los alimentos fritos.  Evite agacharse o recostarse hasta 2 o 3horas despus de haber comido.  Evite la menta y [place] buena, la cafena, el alcohol y el chocolate. Esta informacin no tiene Theme park manager el consejo del mdico. Asegrese de hacerle al mdico cualquier pregunta que tenga. Document Released: 04/06/2012 Document Revised: 05/12/2017 Document Reviewed: 05/12/2017 Elsevier Interactive Patient Education  2019 [employer].

## 2018-12-06 NOTE — Progress Notes (Signed)
   Subjective   Patient ID: April Taylor    DOB: 1986-04-15, 33 y.o. female   MRN: [id number] [employer] interpreter used: Effie Berkshire [phone]  CC: "Stomach has been full"  HPI: April Taylor is a 33 y.o. female who presents to clinic today for the following:  ABDOMINAL BLOATING  Onset: 6 months, was afraid it may be something bad and would not go away Medications tried: no Similar pain before: no but worse at night Prior abdominal surgeries: c-sec  Symptoms Nausea/vomiting: no Diarrhea: no Constipation: no Blood in stool: no Blood in vomit: n/a Fever: no Dysuria: no Loss of appetite: no Weight loss: no Vaginal Bleeding: no Missed menstrual period: no Gets worse after eating and lying down after dinner  ROS: see HPI for pertinent.  PMFSH: Reviewed. Smoking status reviewed. Medications reviewed.  Objective   BP 122/80   Pulse 81   Temp 98.8 F (37.1 C) (Oral)   Ht 5\' 3"  (1.6 m)   Wt 135 lb 9.6 oz (61.5 kg)   SpO2 99%   BMI 24.02 kg/m  Vitals and nursing note reviewed.  General: well nourished, well developed, NAD with non-toxic appearance HEENT: normocephalic, atraumatic, moist mucous membranes Cardiovascular: regular rate and rhythm without murmurs, rubs, or gallops Lungs: clear to auscultation bilaterally with normal work of breathing Abdomen: soft, non-tender, non-distended, normoactive bowel sounds, absent hepatosplenomegaly Skin: warm, dry, no rashes or lesions, cap refill < 2 seconds Extremities: warm and well perfused, normal tone, no edema  Assessment & Plan   Gastroesophageal reflux disease Acute.  No red flags.  Prior history of reflux. - Discussed conservative management and given formation on foods to avoid - Advised to try Pepcid over-the-counter as needed and given 2-week prescription for Protonix - Reviewed return precautions, RTC as needed  No orders of the defined types were placed in this encounter.  Meds ordered this  encounter  Medications  . pantoprazole (PROTONIX) 20 MG tablet    Sig: Take 1 tablet (20 mg total) by mouth daily for 14 days.    Dispense:  14 tablet    Refill:  0    Durward Parcel, DO [facility] Family Medicine, PGY-3 12/06/2018, 11:52 AM

## 2019-03-23 ENCOUNTER — Other Ambulatory Visit: Payer: Self-pay

## 2019-03-23 ENCOUNTER — Encounter: Payer: Self-pay | Admitting: Family Medicine

## 2019-03-23 ENCOUNTER — Ambulatory Visit (INDEPENDENT_AMBULATORY_CARE_PROVIDER_SITE_OTHER): Payer: Self-pay | Admitting: Family Medicine

## 2019-03-23 VITALS — BP 102/70 | Wt 132.0 lb

## 2019-03-23 DIAGNOSIS — Z3491 Encounter for supervision of normal pregnancy, unspecified, first trimester: Secondary | ICD-10-CM

## 2019-03-23 DIAGNOSIS — Z3200 Encounter for pregnancy test, result unknown: Secondary | ICD-10-CM

## 2019-03-23 LAB — POCT URINE PREGNANCY: Preg Test, Ur: POSITIVE — AB

## 2019-03-23 NOTE — Patient Instructions (Addendum)
It was wonderful meeting you today! Congratulations! I would like you to follow-up with [facility], phone number [phone].  They will discuss your additional options, including following up here or within the health department for your prenatal care.  Please continue to take your prenatal vitamins.

## 2019-03-23 NOTE — Assessment & Plan Note (Addendum)
EDD 10/27/2019, gestation age estimated 8 weeks 6 days by LMP.  First encounter after positive pregnancy test at home, confirmed in the office today.  Desired pregnancy.  Already taking her prenatal vitamins.  Pregnancy complicated by maternal AMA (33yo), psoriasis, ? History of herpes labialis.  2 previous pregnancies and vaginal deliveries, uncomplicated. -Will have patient establish with the adopt-a-mom program, to then schedule initial prenatal visit with associated lab/ultrasound. - Continue prenatal vitamins

## 2019-03-23 NOTE — Progress Notes (Signed)
   Subjective:    Patient ID: April Taylor, female    DOB: April 29, 1986, 33 y.o.   MRN: [id number]   CC: Possible pregnancy  HPI: April Taylor is a 33 year old G60P2002 female presenting discuss the following: Spanish interpreter via iPad was used during this encounter.  Possible pregnancy: Presents today as she has had 2+ pregnancy test at home.  Upreg positive in the office today.  She says this was not necessarily a surprise as she was not using a birth control or using additional protection.  Desires to keep pregnancy, she is hoping for a girl as she has 2 boys at home.  She denies any symptoms including abdominal pain, vaginal bleeding, nausea, vomiting.  First day of LMP April 11/2018.  She is "almost sure" of this date.   Her 2 previous pregnancies were vaginal deliveries, full-term.  Healthy boys, now 35 and 8.  No previous pregnancy complications in the past.  Past medical history includes psoriasis and distant herpes history that the patient is not familiar with but is within her chart.  She is not taking any medications except a prenatal vitamin for which she has been taking for the past month.  She has no insurance, used [facility] during her previous pregnancies, however has not contacted them this time yet.  Smoking status reviewed- non-smoker.  Denies any alcohol or illicit drug use.  Review of Systems Per HPI, also denies recent illness, fever, headache, changes in vision, chest pain, shortness of breath, abdominal pain, N/V/D, weakness   Patient Active Problem List   Diagnosis Date Noted  . First trimester pregnancy 03/23/2019  . Gastroesophageal reflux disease 12/06/2018  . TMJ click 09/01/2018  . Contraception management 01/05/2015  . Herpes labialis 01/12/2013  . Menometrorrhagia 01/19/2012  . Psoriasis 06/09/2011     Objective:  BP 102/70   Wt 132 lb (59.9 kg)   LMP 01/20/2019 (Exact Date)   BMI 23.38 kg/m  Vitals and nursing note reviewed   General: NAD, pleasant Cardiac: RRR, normal heart sounds, no murmurs Respiratory: CTAB, normal effort Abdomen: soft, nontender, nondistended, normoactive bowel sounds Extremities: no edema or cyanosis. WWP. Skin: warm and dry, no rashes noted Neuro: alert and oriented, no focal deficits Psych: normal affect  Assessment & Plan:   First trimester pregnancy EDD 10/27/2019, gestation age estimated 8 weeks 6 days by LMP.  First encounter after positive pregnancy test at home, confirmed in the office today.  Desired pregnancy.  Already taking her prenatal vitamins.  Pregnancy complicated by maternal AMA (33yo), psoriasis, ? History of herpes labialis.  2 previous pregnancies and vaginal deliveries, uncomplicated. -Will have patient establish with the adopt-a-mom program, to then schedule initial prenatal visit with associated lab/ultrasound. - Continue prenatal vitamins -Precepted case with Dr. Shawnie Pons.  April Penna, DO Family Medicine Resident PGY-1

## 2019-04-08 ENCOUNTER — Other Ambulatory Visit (INDEPENDENT_AMBULATORY_CARE_PROVIDER_SITE_OTHER): Payer: Self-pay

## 2019-04-08 ENCOUNTER — Other Ambulatory Visit: Payer: Self-pay

## 2019-04-08 DIAGNOSIS — Z3201 Encounter for pregnancy test, result positive: Secondary | ICD-10-CM

## 2019-04-08 DIAGNOSIS — Z3491 Encounter for supervision of normal pregnancy, unspecified, first trimester: Secondary | ICD-10-CM

## 2019-04-08 LAB — POCT URINALYSIS DIP (MANUAL ENTRY)
Bilirubin, UA: NEGATIVE
Glucose, UA: NEGATIVE mg/dL
Ketones, POC UA: NEGATIVE mg/dL
Nitrite, UA: NEGATIVE
Protein Ur, POC: NEGATIVE mg/dL
Spec Grav, UA: 1.015 (ref 1.010–1.025)
Urobilinogen, UA: 0.2 E.U./dL
pH, UA: 8.5 — AB (ref 5.0–8.0)

## 2019-04-12 LAB — OBSTETRIC PANEL, INCLUDING HIV
Antibody Screen: NEGATIVE
Basophils Absolute: 0 10*3/uL (ref 0.0–0.2)
Basos: 0 %
EOS (ABSOLUTE): 0.1 10*3/uL (ref 0.0–0.4)
Eos: 1 %
HIV Screen 4th Generation wRfx: NONREACTIVE
Hematocrit: 41.7 % (ref 34.0–46.6)
Hemoglobin: 14.1 g/dL (ref 11.1–15.9)
Hepatitis B Surface Ag: NEGATIVE
Immature Grans (Abs): 0 10*3/uL (ref 0.0–0.1)
Immature Granulocytes: 0 %
Lymphocytes Absolute: 1.5 10*3/uL (ref 0.7–3.1)
Lymphs: 20 %
MCH: 29.6 pg (ref 26.6–33.0)
MCHC: 33.8 g/dL (ref 31.5–35.7)
MCV: 87 fL (ref 79–97)
Monocytes Absolute: 0.4 10*3/uL (ref 0.1–0.9)
Monocytes: 5 %
Neutrophils Absolute: 5.4 10*3/uL (ref 1.4–7.0)
Neutrophils: 74 %
Platelets: 230 10*3/uL (ref 150–450)
RBC: 4.77 x10E6/uL (ref 3.77–5.28)
RDW: 13.1 % (ref 11.7–15.4)
RPR Ser Ql: NONREACTIVE
Rh Factor: POSITIVE
Rubella Antibodies, IGG: 6.68 index (ref >0.99)
WBC: 7.4 10*3/uL (ref 3.4–10.8)

## 2019-04-12 LAB — HGB FRAC. W/SOLUBILITY
Hgb A2 Quant: 2.7 % (ref 1.8–3.2)
Hgb A: 97.3 % (ref 96.4–98.8)
Hgb C: 0 %
Hgb F Quant: 0 % (ref 0.0–2.0)
Hgb S: 0 %
Hgb Solubility: NEGATIVE
Hgb Variant: 0 %

## 2019-04-14 ENCOUNTER — Ambulatory Visit: Payer: Self-pay | Admitting: Family Medicine

## 2019-04-15 ENCOUNTER — Ambulatory Visit: Payer: Self-pay | Admitting: Family Medicine

## 2019-04-15 ENCOUNTER — Other Ambulatory Visit: Payer: Self-pay

## 2019-04-15 ENCOUNTER — Encounter: Payer: Self-pay | Admitting: Family Medicine

## 2019-04-15 ENCOUNTER — Ambulatory Visit (INDEPENDENT_AMBULATORY_CARE_PROVIDER_SITE_OTHER): Payer: Self-pay | Admitting: Family Medicine

## 2019-04-15 VITALS — BP 110/80 | HR 87 | Wt 131.6 lb

## 2019-04-15 DIAGNOSIS — Z3491 Encounter for supervision of normal pregnancy, unspecified, first trimester: Secondary | ICD-10-CM

## 2019-04-15 NOTE — Patient Instructions (Addendum)
Thank you for coming in to see [place] today. Please see below to review our plan for today's visit.  1. Congratulations on your 3rd pregnancy! Please follow-up in 4 weeks for your next parental visit.  2. I will arrange for our initial OB ultrasound anatomy scan. 3. Please call [place] at [phone]. 4. Our office will call you to discuss the appointment for the anatomy scan.  Please call the clinic at [phone] if your symptoms worsen or you have any concerns. It was our pleasure to serve you.  Harriet [place], [place], PGY-3

## 2019-04-15 NOTE — Progress Notes (Signed)
April Taylor is a 33 y.o. yo G3P2002 at Unknown who presents for her initial prenatal visit. Str[employer] Spanish interpreter used: Jesus [phone] Pregnancy is planned She reports none. She  is taking PNV. See flow sheet for details.  PMH, POBH, FH, meds, allergies and Social Hx reviewed.  Prenatal Exam: Gen: Well nourished, well developed.  No distress.  Vitals noted. HEENT: Normocephalic, atraumatic.  Neck supple without cervical lymphadenopathy, thyromegaly or thyroid nodules.  Fair dentition. CV: RRR no murmur, gallops or rubs Lungs: CTAB.  Normal respiratory effort without wheezes or rales. Abd: soft, NTND. +BS.  Uterus not appreciated above pelvis. GU: Normal external female genitalia without lesions.  Normal vaginal, well rugated without lesions. No vaginal discharge.  Bimanual exam: No adnexal mass or TTP. No CMT.  Uterus size not evaluated (too early) Ext: No clubbing, cyanosis or edema. Psych: Normal grooming and dress.  Not depressed or anxious appearing.  Normal thought content and process without flight of ideas or looseness of associations.  Assessment & Plan: 1) 33 y.o. yo G3P2002 at 73w1dvia LMP doing well.  Current pregnancy issues include none. Dating is reliable. Prenatal labs reviewed, notable for no abnormalities. Genetic screening offered: declined. Early glucola is not indicated.  PHQ-9 and Pregnancy Medical Home forms completed and reviewed.  Bleeding and pain precautions reviewed. Importance of prenatal vitamins reviewed.  Initial OB anatomy scan scheduled through [facility]mom Follow up in 4 weeks.

## 2019-04-17 LAB — CULTURE, OB URINE

## 2019-04-17 LAB — URINE CULTURE, OB REFLEX

## 2019-04-18 ENCOUNTER — Telehealth: Payer: Self-pay

## 2019-04-18 NOTE — Telephone Encounter (Signed)
Called patient using [employer] ID # [id number].  Voice message was left informing patient of Detailed Anatomy Scan ultrasound at [facility] Department on 05/16/2019 at 1130.  Patient is to carry $ 160 in cash with her for procedure.  The number of the [facility] Department was left as well [phone].  Ozella Almond, [place]

## 2019-04-27 ENCOUNTER — Encounter: Payer: Self-pay | Admitting: Family Medicine

## 2019-04-27 DIAGNOSIS — R8271 Bacteriuria: Secondary | ICD-10-CM | POA: Insufficient documentation

## 2019-05-06 ENCOUNTER — Telehealth: Payer: Self-pay

## 2019-05-06 NOTE — Telephone Encounter (Signed)
Attempted to contact patient with results via Varna AnnetteID# [phone].  No answer and no voice mail.  Ozella Almond, [place]

## 2019-05-09 ENCOUNTER — Telehealth: Payer: Self-pay

## 2019-05-09 ENCOUNTER — Encounter: Payer: Self-pay | Admitting: Family Medicine

## 2019-05-09 NOTE — Telephone Encounter (Signed)
Thank you for trying to reach this patient! Letter sent. Will route to Dr. Tarry Kos who is scheduled to see the patient on 7/24 to ensure patient is aware of these results along with spanish interpreter (please see previous result notes for labs).   Best,  Dr. Higinio Plan

## 2019-05-09 NOTE — Telephone Encounter (Signed)
Please send letter to patient as none of the numbers in her chart answers.  Attempted to reach patient x 3 with no answer amd no voicemail.  Ozella Almond, [place]

## 2019-05-13 ENCOUNTER — Encounter: Payer: Self-pay | Admitting: Family Medicine

## 2019-05-13 ENCOUNTER — Other Ambulatory Visit: Payer: Self-pay

## 2019-05-13 ENCOUNTER — Telehealth: Payer: Self-pay

## 2019-05-13 ENCOUNTER — Ambulatory Visit (INDEPENDENT_AMBULATORY_CARE_PROVIDER_SITE_OTHER): Payer: Self-pay | Admitting: Family Medicine

## 2019-05-13 VITALS — BP 110/80 | HR 88 | Wt 131.6 lb

## 2019-05-13 DIAGNOSIS — Z3482 Encounter for supervision of other normal pregnancy, second trimester: Secondary | ICD-10-CM

## 2019-05-13 NOTE — Progress Notes (Signed)
   Subjective:   Patient ID: April Taylor    DOB: 07-29-1986, 33 y.o. female   MRN: [id number]  April Taylor is a 33 y.o. G3P2002 at [redacted]w[redacted]d by LMP here for routine follow up.  She reports headache. See flow sheet for details.  Posterior Headache: She notes she gets these headaches in the morning. She notes she has chronic headaches. Takes tylenol which helps. Light does seem to make the headaches worse.    Review of Systems:  Per HPI.   [name], medications and smoking status reviewed.  Objective:   BP 110/80   Pulse 88   Wt 131 lb 9.6 oz (59.7 kg)   LMP 01/20/2019 (Exact Date)   BMI 23.31 kg/m  Vitals and nursing note reviewed.  General: well nourished, well developed, in no acute distress with non-toxic appearance, sitting comfortably on exam chair  HEENT: normocephalic, atraumatic, moist mucous membranes Neck: supple, normal ROM CV: regular rate and rhythm without murmurs, rubs, or gallops, no lower extremity edema Lungs: clear to auscultation bilaterally with normal work of breathing Abdomen: soft, non-tender, non-distended, normoactive bowel sounds, Uterus size not significantly appreciated (too early) Skin: warm, dry, no rashes or lesions Extremities: warm and well perfused, normal tone MSK:  gait normal Neuro: Alert and oriented, speech normal  Assessment & Plan:   A/P: Pregnancy at [redacted]w[redacted]d.  Doing well.   Pregnancy issues include GBS culture positive and headache. Headache: recommended Tylenol PRN, heating pad, and Lidocaine patches as needed  Anatomy ultrasound ordered and scheduled for 05/30/2019 at [facility] Patient is not interested in genetic screening. Bleeding and pain precautions reviewed. Importance of prenatal vitamins reviewed.  Follow up 4 weeks.  Orders Placed This Encounter  Procedures  . US OB Comp + 14 Wk    Standing Status:   Future    Standing Expiration Date:   07/13/2020    Order Specific Question:   Reason for Exam  (SYMPTOM  OR DIAGNOSIS REQUIRED)    Answer:   Pregnant    Order Specific Question:   Preferred Imaging Location?    Answer:   External    Comments:   Health Department    April Marble, DO PGY-2, [place] [facility] 05/13/2019 12:53 PM

## 2019-05-13 NOTE — Telephone Encounter (Signed)
Called Noella Turner from [facility] and LVM concerning the change of appointment to 05/30/2019 at 1030. Health Department.  Patient is to carry $160 at least.  Could be less but no more.  Noella accompanied patient at visit 05/13/2019 and patient asked if I could call Noella to relay message of appointment.    Ozella Almond, [place]

## 2019-05-13 NOTE — Patient Instructions (Signed)
Segundo trimestre de embarazo Second Trimester of Pregnancy El segundo trimestre va desde la semana14 hasta la 27, desde el cuarto hasta el sexto mes, y suele ser el momento en el que mejor se siente. Su organismo se ha adaptado a estar embarazada, y comienza a sentirse fsicamente mejor. En general, las nuseas matutinas han disminuido o han desaparecido completamente, puede tener ms energa y un aumento de apetito. El segundo trimestre es tambin la poca en la que el feto se desarrolla rpidamente. Hacia el final del sexto mes, el feto mide aproximadamente 9pulgadas (23cm) y pesa alrededor de 1 libras (700g). Es probable que sienta que el beb se mueve (da pataditas) entre las 16 y 20semanas del embarazo. Cambios en el cuerpo durante el segundo trimestre Su cuerpo continua experimentando numerosos cambios durante su segundo trimestre. Estos cambios varan de una mujer a otra.  Seguir aumentando de peso. Notar que la parte baja del abdomen sobresale.  Podrn aparecer las primeras estras en las caderas, el abdomen y las mamas.  Es posible que tenga dolores de cabeza que pueden aliviarse con ciertos medicamentos. Los medicamentos que tome deben estar aprobados por el mdico.  Tal vez tenga necesidad de orinar con ms frecuencia porque el feto est ejerciendo presin sobre la vejiga.  Debido al embarazo podr sentir acidez estomacal con frecuencia.  Puede estar estreida, ya que ciertas hormonas enlentecen los movimientos de los msculos que empujan los desechos a travs de los intestinos.  Pueden aparecer hemorroides o abultarse e hincharse las venas (venas varicosas).  Puede sentir dolor en la espalda. Esto se debe a: ? Aumento de peso. ? Las hormonas del embarazo relajan las articulaciones en la pelvis. ? Un cambio en el peso y los msculos que ayudan a mantener su equilibrio.  Sus pechos seguirn creciendo y se pondrn cada vez ms sensibles.  Las encas pueden sangrar y estar  sensibles al cepillado y al hilo dental.  Pueden aparecer zonas oscuras o manchas (cloasma, mscara del embarazo) en el rostro. Esto probablemente se atenuar despus del nacimiento del beb.  Es posible que se forme una lnea oscura desde el ombligo hasta la zona del pubis (linea nigra). Esto probablemente se atenuar despus del nacimiento del beb.  Tal vez haya cambios en el cabello. Esto cambios pueden incluir su engrosamiento, crecimiento rpido y cambios en la textura. Adems, a algunas mujeres se les cae el cabello durante o despus del embarazo, o tienen el cabello seco o fino. Lo ms probable es que el cabello se le normalice despus del nacimiento del beb. Qu debe esperar en las visitas prenatales Durante una visita prenatal de rutina:  La pesarn para asegurarse de que usted y el feto estn creciendo normalmente.  Le tomarn la presin arterial.  Le medirn el abdomen para controlar el desarrollo del beb.  Se escucharn los latidos cardacos fetales.  Se evaluarn los resultados de los estudios solicitados en visitas anteriores. El mdico puede preguntarle lo siguiente:  Cmo se siente.  Si siente los movimientos del beb.  Si ha tenido sntomas anormales, como prdida de lquido, sangrado, dolores de cabeza intensos o clicos abdominales.  Si est consumiendo algn producto que contenga tabaco, como cigarrillos, tabaco de mascar y cigarrillos electrnicos.  Si tiene alguna pregunta. Otros estudios que podrn realizarse durante el segundo trimestre incluyen lo siguiente:  Anlisis de sangre para detectar lo siguiente: ? Concentraciones de hierro bajas (anemia). ? Nivel alto de azcar en la sangre que afecta a las mujeres embarazadas (diabetes   gestacional) entre las semanas 24 y 28. ? Anticuerpos Rh. Esto es para detectar una protena en los glbulos rojos (factor Rh).  Anlisis de orina para detectar infecciones, diabetes o protenas en la orina.  Una ecografa  para confirmar que el beb crece y se desarrolla correctamente.  Una amniocentesis para diagnosticar posibles problemas genticos.  Estudios del feto para descartar espina bfida y sndrome de Down.  Prueba del VIH (virus de inmunodeficiencia humana). Los exmenes prenatales de rutina incluyen la prueba de deteccin del VIH, a menos que decida no realizrsela. Siga estas indicaciones en su casa: Medicamentos  Siga las indicaciones del mdico en relacin con el uso de medicamentos. Durante el embarazo, hay medicamentos que pueden tomarse y otros que no.  Tome vitaminas prenatales que contengan por lo menos 600microgramos (?g) de cido flico.  Si est estreida, tome un laxante suave, si el mdico lo autoriza. Qu debe comer y beber   Lleve una dieta equilibrada que incluya gran cantidad de frutas y verduras frescas, cereales integrales, buenas fuentes de protenas como carnes magras, huevos o tofu, y lcteos descremados. El mdico la ayudar a determinar la cantidad de peso que puede aumentar.  No coma carne cruda ni quesos sin cocinar. Estos elementos contienen grmenes que pueden causar defectos congnitos en el beb.  Si no consume muchos alimentos con calcio, hable con su mdico sobre si debera tomar un suplemento diario de calcio.  Limite el consumo de alimentos con alto contenido de grasas y azcares procesados, como alimentos fritos o dulces.  Para evitar el estreimiento: ? Bebe suficiente lquido para mantener la orina clara o de color amarillo plido. ? Consuma alimentos ricos en fibra, como frutas y verduras frescas, cereales integrales y frijoles. Actividad  Haga ejercicio solamente como se lo haya indicado el mdico. La mayora de las mujeres pueden continuar su rutina de ejercicios durante el embarazo. Intente realizar como mnimo 30minutos de actividad fsica por lo menos 5das a la semana. Deje de hacer ejercicio si experimenta contracciones uterinas.  No levante  objetos pesados, use zapatos de tacones bajos y mantenga una buena postura.  Puede seguir manteniendo relaciones sexuales, a menos que el mdico le indique lo contrario. Alivio del dolor y del malestar  Use un sostn que le brinde buen soporte para prevenir las molestias causadas por la sensibilidad en los pechos.  Dese baos de asiento con agua tibia para aliviar el dolor o las molestias causadas por las hemorroides. Use una crema para las hemorroides si el mdico la autoriza.  Descanse con las piernas elevadas si tiene calambres o dolor de cintura.  Si tiene venas varicosas, use medias de descanso. Eleve los pies durante 15minutos, 3 o 4veces por da. Limite el consumo de sal en su dieta. Cuidados prenatales  Escriba sus preguntas. Llvelas cuando concurra a las visitas prenatales.  Concurra a todas las visitas prenatales tal como se lo haya indicado el mdico. Esto es importante. Seguridad  Use el cinturn de seguridad en todo momento mientras conduce.  Haga una lista de los nmeros de telfono de emergencia, que incluya los nmeros de telfono de familiares, amigos, el hospital y los departamentos de polica y bomberos. Instrucciones generales  Pdale al mdico que la derive a clases de educacin prenatal en su localidad. Debe comenzar a tomar las clases antes de que empiece el mes6 de embarazo.  Pida ayuda si tiene necesidades nutricionales o de asesoramiento durante el embarazo. El mdico puede aconsejarla o derivarla a especialistas para que la   ayuden con diferentes necesidades.  No se d baos de inmersin en agua caliente, baos turcos ni saunas.  No se haga duchas vaginales ni use tampones o toallas higinicas perfumadas.  No mantenga las piernas cruzadas durante mucho tiempo.  Evite el contacto con las bandejas sanitarias de los gatos y la tierra que estos animales usan. Estos elementos contienen bacterias que pueden causar defectos congnitos al beb y la posible  prdida del feto debido a un aborto espontneo o muerte fetal.  Evite fumar, consumir hierbas, beber alcohol y tomar frmacos que no le hayan recetado. Las sustancias qumicas que estos productos contienen pueden afectar la formacin y el desarrollo del beb.  No consuma ningn producto que contenga nicotina o tabaco, como cigarrillos y cigarrillos electrnicos. Si necesita ayuda para dejar de fumar, consulte al mdico.  Visite a su dentista si an no lo ha hecho durante el embarazo. Use un cepillo de dientes blando para higienizarse los dientes y psese el hilo dental con suavidad. Comunquese con un mdico si:  Tiene mareos.  Siente clicos leves, presin en la pelvis o dolor persistente en el abdomen.  Tiene nuseas, vmitos o diarrea persistentes.  Observa una secrecin vaginal con mal olor.  Siente dolor al orinar. Solicite ayuda de inmediato si:  Tiene fiebre.  Tiene una prdida de lquido por la vagina.  Tiene sangrado o pequeas prdidas vaginales.  Siente dolor intenso o clicos en el abdomen.  Sube de peso o baja de peso rpidamente.  Tiene dificultad para respirar y siente dolor de pecho.  Sbitamente se le hinchan mucho el rostro, las manos, los tobillos, los pies o las piernas.  No ha sentido los movimientos del beb durante una hora.  Siente un dolor de cabeza intenso que no se alivia al tomar medicamentos.  Nota cambios en la visin. Resumen  El segundo trimestre va desde la semana14 hasta la 27, desde el cuarto hasta el sexto mes. Es tambin una poca en la que el feto se desarrolla rpidamente.  Su organismo atraviesa por muchos cambios durante el embarazo. Estos cambios varan de una mujer a otra.  Evite fumar, consumir hierbas, beber alcohol y tomar frmacos que no le hayan recetado. Estas sustancias qumicas afectan la formacin y el desarrollo de su beb.  No consuma ningn producto que contenga tabaco, lo que incluye cigarrillos, tabaco de mascar  y cigarrillos electrnicos. Si necesita ayuda para dejar de fumar, consulte al mdico.  Comunquese con su mdico si tiene preguntas sobre esto. Concurra a todas las visitas prenatales tal como se lo haya indicado el mdico. Esto es importante. Esta informacin no tiene como fin reemplazar el consejo del mdico. Asegrese de hacerle al mdico cualquier pregunta que tenga. Document Released: 07/16/2005 Document Revised: 02/16/2017 Document Reviewed: 02/16/2017 Elsevier Patient Education  2020 [employer]  

## 2019-06-14 ENCOUNTER — Other Ambulatory Visit: Payer: Self-pay

## 2019-06-14 ENCOUNTER — Ambulatory Visit (INDEPENDENT_AMBULATORY_CARE_PROVIDER_SITE_OTHER): Payer: Self-pay | Admitting: Family Medicine

## 2019-06-14 DIAGNOSIS — Z3492 Encounter for supervision of normal pregnancy, unspecified, second trimester: Secondary | ICD-10-CM

## 2019-06-14 NOTE — Progress Notes (Signed)
1 

## 2019-06-14 NOTE — Patient Instructions (Addendum)
Wonderful to see you.  Glad that you are doing well.  You are measuring great and baby boy has a wonderful heartbeat! We will see you back in about 4 weeks for regular follow-up.  If you have any concerns in between this time, new onset loss of fluid or vaginal bleeding-please make sure you seek medical care prior to follow-up.

## 2019-06-14 NOTE — Progress Notes (Signed)
  Patient Name: Jesslyn Viglione Date of Birth: 1986/09/07 Date of Visit: 06/14/19 PCP: Patriciaann Clan, DO  Chief Complaint: prenatal care  Subjective: Alantis Bethune is a pleasant [phone] at [redacted]w[redacted]d dated by LMP. She has no unusual complaints today. Reports good fetal movement. Denies contractions, loss of fluid, or bleeding.  She had an ultrasound performed by the health department at 18 weeks 5 days, anatomy showing female.  Do not have the results (U/S reading) from the health department yet.  She is excited, no names decided.   ROS:  Review of Systems  Constitutional: Negative for fever and malaise/fatigue.  Respiratory: Negative for shortness of breath.   Cardiovascular: Negative for chest pain, palpitations and leg swelling.  Musculoskeletal: Negative for back pain.    I have reviewed the patient's medical, surgical, family, and social history as appropriate.   Pertinent PMH: Psoriasis, GERD, GBS bacteriuria in current pregnancy, herpes labialis without any previous history of genital herpes  Prior Obstetric History: G3P2002, both previous NSVD and uncomplicated.  Complications of Current Pregnancy: None   Vitals:   06/14/19 1336  BP: 122/80  Pulse: 85   Last Weight  Most recent update: 06/14/2019  1:37 PM   Weight  60.8 kg (134 lb)           Gained approximately 3 pounds and previous visit in July/2020.  Fetal heart rate average 145 with handheld Doppler.  General: Alert, NAD HEENT: NCAT, MMM, oropharynx nonerythematous  Cardiac: RRR no m/g/r Lungs: Clear bilaterally, no increased WOB  Abdomen: soft, gravid uterus to approximate level of umbilicus (measured around 20-21 cm) Msk: Moves all extremities spontaneously  Ext: Warm, dry, 2+ distal pulses, no edema    Kaylyne is a 33 year old G24P2002 female in her second trimester at [redacted]w[redacted]d gestation.  She is doing quite well.   Anticipatory Guidance and Prenatal Education provided on the following  topics: - Reasons to present to [facility]U - Nutrition in pregnancy - Contraception postpartum - Breastfeed  Will obtain ultrasound records from the health department for anatomy scan at ~18 weeks.  Reviewed genetic family history/infectious screening today.  Discussed continued use of prenatal vitamins.  Follow-up in approximately 4 weeks or sooner if needed.  Darrelyn Hillock, DO  Family medicine PGY-2

## 2019-06-15 ENCOUNTER — Encounter: Payer: Self-pay | Admitting: Family Medicine

## 2019-07-13 ENCOUNTER — Ambulatory Visit (INDEPENDENT_AMBULATORY_CARE_PROVIDER_SITE_OTHER): Payer: Self-pay | Admitting: Family Medicine

## 2019-07-13 ENCOUNTER — Other Ambulatory Visit: Payer: Self-pay

## 2019-07-13 ENCOUNTER — Encounter: Payer: Self-pay | Admitting: Family Medicine

## 2019-07-13 VITALS — BP 120/64 | HR 102 | Wt 140.2 lb

## 2019-07-13 DIAGNOSIS — R9389 Abnormal findings on diagnostic imaging of other specified body structures: Secondary | ICD-10-CM

## 2019-07-13 DIAGNOSIS — Z3482 Encounter for supervision of other normal pregnancy, second trimester: Secondary | ICD-10-CM

## 2019-07-13 DIAGNOSIS — L409 Psoriasis, unspecified: Secondary | ICD-10-CM

## 2019-07-13 MED ORDER — TRIAMCINOLONE ACETONIDE 0.1 % EX CREA: 1.0000 "application " | TOPICAL_CREAM | Freq: Two times a day (BID) | CUTANEOUS | 0 refills | Status: DC

## 2019-07-13 NOTE — Patient Instructions (Signed)
It was wonderful seeing you.  I have sent in a cream/topical liquid to use on your scalp for up to 2 weeks to help with this.  I would like you to follow-up in about 3 weeks for a regular prenatal visit.  Expect this will be longer as you will need to get labs and do the 1 hour glucose screening.

## 2019-07-13 NOTE — Progress Notes (Signed)
  Patient Name: Jorita Bohanon Date of Birth: [birth year] Date of Visit: 07/13/19 PCP: Patriciaann Clan, DO  Chief Complaint: prenatal care  Subjective: Secily Walthour is a pleasant [phone] at  [redacted]w[redacted]d dated by LMP consistent with 2nd trimester U/S. Reports good fetal movement. Denies contractions, loss of fluid, or bleeding.   Would like a refill of her clobetasol liquid for her psoriasis, does not have any at home.  Currently "acting up" on her posterior scalp.  Still do not have ultrasound results from her anatomy scan at 18 weeks.  In person interpreter used for duration of visit.  ROS:  Review of Systems  Constitutional: Negative for chills, fever and malaise/fatigue.  Respiratory: Negative for shortness of breath.   Cardiovascular: Negative for chest pain and leg swelling.  Genitourinary: Negative for dysuria.  Neurological: Negative for dizziness and headaches.    I have reviewed the patient's medical, surgical, family, and social history as appropriate.   Pertinent PMH: Psoriasis, GERD, herpes labialis without any previous history of genital herpes  Prior Obstetric History: G3P2002, both previous NSVD and uncomplicated.  Complications of Current Pregnancy:  - Mild bilateral renal pelvic fullness measuring 5 mm on 18-week ultrasound  - AMA (33) - GBS bacteriuria in current pregnancy, will need prophylaxis at delivery  Vitals:   07/13/19 1004  BP: 120/64  Pulse: (!) 102   Last Weight  Most recent update: 07/13/2019 10:07 AM   Weight  63.6 kg (140 lb 4 oz)           Filed Weights   07/13/19 1004  Weight: 140 lb 4 oz (63.6 kg)  Approximate 9 pound weight gain in pregnancy thus far. Fundal height 25 cm. Fetal heart rate averages 135.   Derm: Erythematous patch with silvery scale on posterior scalp extending into hair, few centimeters.   A/p:  Adlean was seen today for routine prenatal visit at [redacted]w[redacted]d, doing well.   Abnormal ultrasound:  Received ultrasound results from [facility] shortly after appointment completion. Noted mild bilateral renal pelvic fullness measuring 5 mm.  - Repeat ultrasound at 32 weeks (will need this scheduled) - Will have ultrasound read scanned into chart  Psoriasis, scalp:  Discussed with patient that we will try a moderate potency steroid prior to initiating high potency clobetasol to avoid any adverse outcomes associated with pregnancy if possible. -     triamcinolone cream (KENALOG) 0.1 %; Apply 1 application topically 2 (two) times daily. For two weeks. -  Follow-up if not improving with above therapy, or sooner if worsening  Anticipatory Guidance and Prenatal Education provided on the following topics: - Preterm labor signs - Reasons to present to [facility]U - Breastfeed - Safe sleep for infant   Follow-up in approximately 3 weeks, preferably with OB clinic, for prenatal visit.  Discussed that she will need a 1 hour GTT and labs done at this visit and to expect a longer stay.  Patient would like to wait to receive flu shot when she gets Tdap after 28 weeks.  Darrelyn Hillock, DO Family Medicine

## 2019-07-28 ENCOUNTER — Encounter: Payer: Self-pay | Admitting: Family Medicine

## 2019-08-04 ENCOUNTER — Other Ambulatory Visit: Payer: Self-pay | Admitting: Family Medicine

## 2019-08-04 ENCOUNTER — Other Ambulatory Visit (HOSPITAL_COMMUNITY)
Admission: RE | Admit: 2019-08-04 | Discharge: 2019-08-04 | Disposition: A | Discharge status: Home / Self Care | Payer: Self-pay | Source: Ambulatory Visit | Attending: Family Medicine | Admitting: Family Medicine

## 2019-08-04 ENCOUNTER — Other Ambulatory Visit: Payer: Self-pay

## 2019-08-04 ENCOUNTER — Ambulatory Visit (INDEPENDENT_AMBULATORY_CARE_PROVIDER_SITE_OTHER): Payer: Self-pay | Admitting: Family Medicine

## 2019-08-04 VITALS — BP 122/80 | HR 98 | Wt 144.8 lb

## 2019-08-04 DIAGNOSIS — Z3483 Encounter for supervision of other normal pregnancy, third trimester: Secondary | ICD-10-CM | POA: Insufficient documentation

## 2019-08-04 DIAGNOSIS — L409 Psoriasis, unspecified: Secondary | ICD-10-CM

## 2019-08-04 DIAGNOSIS — Z3403 Encounter for supervision of normal first pregnancy, third trimester: Secondary | ICD-10-CM

## 2019-08-04 DIAGNOSIS — R8271 Bacteriuria: Secondary | ICD-10-CM

## 2019-08-04 LAB — OB RESULTS CONSOLE GC/CHLAMYDIA: Gonorrhea: NEGATIVE

## 2019-08-04 LAB — POCT 1 HR PRENATAL GLUCOSE: Glucose 1 Hr Prenatal, POC: 148 mg/dL

## 2019-08-04 LAB — OB RESULTS CONSOLE GBS: GBS: POSITIVE

## 2019-08-04 NOTE — Patient Instructions (Addendum)
Follow up in 2 weeks   It was wonderful to see you today.  Thank you for choosing [facility].   Please call [phone] with any questions about today's appointment.  Please be sure to schedule follow up at the front  desk before you leave today.   Dorris Singh, MD  Family Medicine   [facility] is on 08/18/2019 at 9 AM. (Thursday, October 29th at 9 AM).   This is at [facility].   I will call you with an interpreter with resutls

## 2019-08-04 NOTE — Progress Notes (Addendum)
  Patient Name: April Taylor Date of Birth: 11/30/1985 Date of Visit: 08/04/19 PCP: Patriciaann Clan, DO  Chief Complaint: prenatal care The patient speaks Spanish as their primary language.  An interpreter was used for the entire visit.   Subjective: [name] is a pleasant G3P2002 at  [redacted]w[redacted]d  dated by LMP (exact)  consistent with 18 week ultrasound. She has no unusual complaints today. Reports good fetal movement. Denies contractions, loss of fluid, or bleeding.   Patient does endorse worsening scalp psoriasis and flaking.  She has some eczema on her leg as well which intermittently bothers her.  This is improving she reports.   ROS: Negative as above.   I have reviewed the patient's medical, surgical, family, and social history as appropriate.   Pertinent PMH:  Herpes labalis, asked again about genital herpes today, no prior history of this   Prior Obstetric History: Patient has had 2 prior vaginal deliveries.  She has 2 boys ages 95 and 63.  Complications of Current Pregnancy: - GBS bacteruria  - AMA, declined genetic testing - Fetal pyelectasis, repeat ultrasound ordered today    Vitals:   08/04/19 0930  BP: 122/80  Pulse: 98   Prepregnancy weight 131 pounds Weight today 144 pounds   GU Exam:  Chaperoned exam.  External exam: Normal-appearing female external genitalia.  Vaginal exam notable for small amount of discharge, GC/CT collected.   Thao was seen today for routine prenatal visit.  Diagnoses and all orders for this visit:  Encounter for supervision of other normal pregnancy in third trimester -     Glucose, 1 hour -     CBC -     HIV antibody (with reflex) -     RPR -     Cervicovaginal ancillary only- not previously obtained  -     Culture, OB Urine--repeat given history of GBS bacteruria  -     POCT 1 Hr Prenatal Glucose -  No indication for antivirals as only history of herpes of oral area. \ - Scheduled for repeat  ultrasound.   Psoriasis, discussed with patient, she wishes to watch this, could consider referral to Dermatology clinic if worsens/bothers her more.   Anticipatory Guidance and Prenatal Education provided on the following topics: - Preterm labor signs --- reviewed  - Reasons to present to [facility]U - Nutrition in pregnancy--discussed walking after meals, reducing sugar sweetened beverages  - Breastfeed - Safe sleep for infant  - Letter for Tdap given today   Seen with Dr. Lyndee Hensen.   Dorris Singh, MD  Family Medicine Teaching Service

## 2019-08-05 ENCOUNTER — Other Ambulatory Visit (INDEPENDENT_AMBULATORY_CARE_PROVIDER_SITE_OTHER): Payer: Self-pay

## 2019-08-05 ENCOUNTER — Encounter: Payer: Self-pay | Admitting: Family Medicine

## 2019-08-05 DIAGNOSIS — Z3483 Encounter for supervision of other normal pregnancy, third trimester: Secondary | ICD-10-CM

## 2019-08-05 LAB — RPR: RPR Ser Ql: NONREACTIVE

## 2019-08-05 LAB — CBC
Hematocrit: 38.8 % (ref 34.0–46.6)
Hemoglobin: 13.2 g/dL (ref 11.1–15.9)
MCH: 30.6 pg (ref 26.6–33.0)
MCHC: 34 g/dL (ref 31.5–35.7)
MCV: 90 fL (ref 79–97)
Platelets: 190 10*3/uL (ref 150–450)
RBC: 4.32 x10E6/uL (ref 3.77–5.28)
RDW: 13.1 % (ref 11.7–15.4)
WBC: 7.4 10*3/uL (ref 3.4–10.8)

## 2019-08-05 LAB — POCT CBG (FASTING - GLUCOSE)-MANUAL ENTRY: Glucose Fasting, POC: 87 mg/dL (ref 70–99)

## 2019-08-05 LAB — HIV ANTIBODY (ROUTINE TESTING W REFLEX): HIV Screen 4th Generation wRfx: NONREACTIVE

## 2019-08-06 ENCOUNTER — Telehealth: Payer: Self-pay | Admitting: Family Medicine

## 2019-08-06 DIAGNOSIS — R8271 Bacteriuria: Secondary | ICD-10-CM

## 2019-08-06 DIAGNOSIS — L409 Psoriasis, unspecified: Secondary | ICD-10-CM

## 2019-08-06 LAB — GESTATIONAL GLUCOSE TOLERANCE
Glucose, Fasting: 76 mg/dL (ref 65–94)
Glucose, GTT - 1 Hour: 203 mg/dL — ABNORMAL HIGH (ref 65–179)
Glucose, GTT - 2 Hour: 108 mg/dL (ref 65–154)
Glucose, GTT - 3 Hour: 96 mg/dL (ref 65–139)

## 2019-08-06 MED ORDER — AMOXICILLIN 500 MG PO CAPS: 500.0000 mg | ORAL_CAPSULE | Freq: Three times a day (TID) | ORAL | 0 refills | Status: AC

## 2019-08-06 MED ORDER — TRIAMCINOLONE ACETONIDE 0.1 % EX OINT: 1.0000 "application " | TOPICAL_OINTMENT | Freq: Two times a day (BID) | CUTANEOUS | 2 refills | Status: DC

## 2019-08-06 NOTE — Telephone Encounter (Signed)
Called patient with results.   - 3 hour test normal - Urine culture continues to demonstrate GBS. Will treat given persistent and as it is GBS.  Recommend test of cure (Ob urine culture).   - Remainder of results normal.   All questions answered.   Dorris Singh, MD  Family Medicine Teaching Service

## 2019-08-09 LAB — URINE CULTURE, OB REFLEX

## 2019-08-09 LAB — CULTURE, OB URINE

## 2019-08-09 LAB — CERVICOVAGINAL ANCILLARY ONLY
Chlamydia: NEGATIVE
Comment: NEGATIVE
Comment: NORMAL
Neisseria Gonorrhea: NEGATIVE

## 2019-08-11 NOTE — Progress Notes (Signed)
  Patient Name: April Taylor Date of Birth: 1986/09/30 Date of Visit: 08/17/19 PCP: Patriciaann Clan, DO Third Trimester Prenatal Visit   Chief Complaint: prenatal care  Subjective: April Taylor is a pleasant G3P2002 at  [redacted]w[redacted]d dated by LMP.She has no unusual complaints today.  She denies vaginal bleeding or discharge. She reports good fetal movement. She denies contractions.  Patient does report some leakage of fluids but this has been happening for the past 2 months.  States is not daily but only every once in a while. See flow sheet for details.  ROS: per HPI.  I have reviewed the patient's medical, surgical, family, and social history as appropriate.   OB labs reviewed: 3-hour GTT showing elevated 1 hour but other 2 values were within normal limits so no GDM.  RPR nonreactive.  Third trimester CBC showing hemoglobin 13.2.  HIV nonreactive. [place] reviewed: @[redacted]w[redacted]d , CWD, normal anatomy, cephalic presentation, anterior placental lie, 238.26g, 35.2% EFW. Mild bilateral renal pelvic fullness measuring 5 mm in AP dimension.  Recommended a follow-up exam at [redacted] weeks gestation  Vitals:   08/16/19 1543  BP: 120/80  Pulse: 85   Last Weight  Most recent update: 08/16/2019  3:44 PM   Weight  66 kg (145 lb 6.4 oz)           See flow sheet for exam.  Fetal heart tones documented in flow sheet  Fundal height documented in flow sheet   A/P: April Taylor is a pleasant G3P2002 at [redacted]w[redacted]d presenting for routine third trimester prenatal care. Overall she is doing well.  Pregnancy issues include:  GBS bacteruria Urine culture positive in first trimester.  Tx with amoxicillin. Will need repeat urine culture today.  AMA Declined genetic testing  Fetal pyelectasis Repeat ultrasound ordered at previous visit on 08/04/19. Initially scheduled for 08/18/19. I advised patient that per [place] report she should have repeat US at 32 weeks. Discussed this with Cat at [facility] who reviewed [place] and agreed patient should have repeat at 32 weeks. Cat informed me she would call patient and re-schedule for when she is at 32w gestation.   Infant feeding choice: breast  Contraception choice: Nexplanon  Infant circumcision desired no  Tdap was not given today. RX to obtain at health department was given Advised patient to get flu vaccine at health department as well.  1 hour glucola, CBC, RPR, and HIV were not done today.   Pregnancy medical home and PHQ-9 forms were not done today and reviewed.   Rh status was reviewed and patient does not need Rhogam.  Rhogam was not given today.   Childbirth and education classes were offered. Patient did not want to attend.  Preterm labor and fetal movement precautions reviewed. Follow up 2 weeks. Anticipatory Guidance and Prenatal Education provided on the following topics: - Recommended continuing Prenatal vitamin  - Discussed options for Contraception postpartum - Discussed mode of delivery - Discussed pain control in labor - Discussed  breastfeeding and benefits for infant  - Discussed safe sleep recommendations and car seat recommendations  - Problem list and prenatal box updated   Follow up in 2 weeks until 36 weeks then weekly from 36 weeks until delivered.

## 2019-08-16 ENCOUNTER — Other Ambulatory Visit: Payer: Self-pay

## 2019-08-16 ENCOUNTER — Ambulatory Visit (INDEPENDENT_AMBULATORY_CARE_PROVIDER_SITE_OTHER): Payer: Self-pay | Admitting: Family Medicine

## 2019-08-16 VITALS — BP 120/80 | HR 85 | Wt 145.4 lb

## 2019-08-16 DIAGNOSIS — R8271 Bacteriuria: Secondary | ICD-10-CM

## 2019-08-16 DIAGNOSIS — Z3483 Encounter for supervision of other normal pregnancy, third trimester: Secondary | ICD-10-CM

## 2019-08-16 MED ORDER — TETANUS-DIPHTH-ACELL PERTUSSIS 5-2.5-18.5 LF-MCG/0.5 IM SUSP: 0.5000 mL | Freq: Once | INTRAMUSCULAR | 0 refills | Status: AC

## 2019-08-16 NOTE — Patient Instructions (Addendum)
For your ultrasound, they will call you to reschedule.  This needs to be done when you are [redacted] weeks pregnant.  These go to the health department to get your Tdap vaccination and influenza vaccine.  This will be important for the baby's health.  If you have any leakage of fluid, decreased fetal movement, discharge, vaginal bleeding, or significant pain please go to the MAU    Third Trimester of Pregnancy  The third trimester is from week 28 through week 40 (months 7 through 9). This trimester is when your unborn baby (fetus) is growing very fast. At the end of the ninth month, the unborn baby is about 20 inches in length. It weighs about 6-10 pounds. Follow these instructions at home: Medicines  Take over-the-counter and prescription medicines only as told by your doctor. Some medicines are safe and some medicines are not safe during pregnancy.  Take a prenatal vitamin that contains at least 600 micrograms (mcg) of folic acid.  If you have trouble pooping (constipation), take medicine that will make your stool soft (stool softener) if your doctor approves. Eating and drinking   Eat regular, healthy meals.  Avoid raw meat and uncooked cheese.  If you get low calcium from the food you eat, talk to your doctor about taking a daily calcium supplement.  Eat four or five small meals rather than three large meals a day.  Avoid foods that are high in fat and sugars, such as fried and sweet foods.  To prevent constipation: ? Eat foods that are high in fiber, like fresh fruits and vegetables, whole grains, and beans. ? Drink enough fluids to keep your pee (urine) clear or pale yellow. Activity  Exercise only as told by your doctor. Stop exercising if you start to have cramps.  Avoid heavy lifting, wear low heels, and sit up straight.  Do not exercise if it is too hot, too humid, or if you are in a place of great height (high altitude).  You may continue to have sex unless your doctor  tells you not to. Relieving pain and discomfort  Wear a good support bra if your breasts are tender.  Take frequent breaks and rest with your legs raised if you have leg cramps or low back pain.  Take warm water baths (sitz baths) to soothe pain or discomfort caused by hemorrhoids. Use hemorrhoid cream if your doctor approves.  If you develop puffy, bulging veins (varicose veins) in your legs: ? Wear support hose or compression stockings as told by your doctor. ? Raise (elevate) your feet for 15 minutes, 3-4 times a day. ? Limit salt in your food. Safety  Wear your seat belt when driving.  Make a list of emergency phone numbers, including numbers for family, friends, the hospital, and police and fire departments. Preparing for your baby's arrival To prepare for the arrival of your baby:  Take prenatal classes.  Practice driving to the hospital.  Visit the hospital and tour the maternity area.  Talk to your work about taking leave once the baby comes.  Pack your hospital bag.  Prepare the baby's room.  Go to your doctor visits.  Buy a rear-facing car seat. Learn how to install it in your car. General instructions  Do not use hot tubs, steam rooms, or saunas.  Do not use any products that contain nicotine or tobacco, such as cigarettes and e-cigarettes. If you need help quitting, ask your doctor.  Do not drink alcohol.  Do not douche  or use tampons or scented sanitary pads.  Do not cross your legs for long periods of time.  Do not travel for long distances unless you must. Only do so if your doctor says it is okay.  Visit your dentist if you have not gone during your pregnancy. Use a soft toothbrush to brush your teeth. Be gentle when you floss.  Avoid cat litter boxes and soil used by cats. These carry germs that can cause birth defects in the baby and can cause a loss of your baby (miscarriage) or stillbirth.  Keep all your prenatal visits as told by your doctor.  This is important. Contact a doctor if:  You are not sure if you are in labor or if your water has broken.  You are dizzy.  You have mild cramps or pressure in your lower belly.  You have a nagging pain in your belly area.  You continue to feel sick to your stomach, you throw up, or you have watery poop.  You have bad smelling fluid coming from your vagina.  You have pain when you pee. Get help right away if:  You have a fever.  You are leaking fluid from your vagina.  You are spotting or bleeding from your vagina.  You have severe belly cramps or pain.  You lose or gain weight quickly.  You have trouble catching your breath and have chest pain.  You notice sudden or extreme puffiness (swelling) of your face, hands, ankles, feet, or legs.  You have not felt the baby move in over an hour.  You have severe headaches that do not go away with medicine.  You have trouble seeing.  You are leaking, or you are having a gush of fluid, from your vagina before you are 37 weeks.  You have regular belly spasms (contractions) before you are 37 weeks. Summary  The third trimester is from week 28 through week 40 (months 7 through 9). This time is when your unborn baby is growing very fast.  Follow your doctor's advice about medicine, food, and activity.  Get ready for the arrival of your baby by taking prenatal classes, getting all the baby items ready, preparing the baby's room, and visiting your doctor to be checked.  Get help right away if you are bleeding from your vagina, or you have chest pain and trouble catching your breath, or if you have not felt your baby move in over an hour. This information is not intended to replace advice given to you by your health care provider. Make sure you discuss any questions you have with your health care provider. Document Released: 12/31/2009 Document Revised: 01/27/2019 Document Reviewed: 11/11/2016 Elsevier Patient Education  2020  [employer].

## 2019-08-17 ENCOUNTER — Other Ambulatory Visit: Payer: Self-pay

## 2019-08-18 LAB — CULTURE, OB URINE

## 2019-08-18 LAB — URINE CULTURE, OB REFLEX

## 2019-08-31 ENCOUNTER — Ambulatory Visit (INDEPENDENT_AMBULATORY_CARE_PROVIDER_SITE_OTHER): Payer: Self-pay | Admitting: Family Medicine

## 2019-08-31 ENCOUNTER — Other Ambulatory Visit: Payer: Self-pay

## 2019-08-31 VITALS — BP 118/78 | HR 96 | Wt 148.4 lb

## 2019-08-31 DIAGNOSIS — Z3483 Encounter for supervision of other normal pregnancy, third trimester: Secondary | ICD-10-CM

## 2019-08-31 NOTE — Patient Instructions (Signed)
It was a pleasure to see you today! Thank you for choosing [facility] for your primary care. April Taylor was seen for prenatal appointment.   Everything seems normal on your appointment today.  Please go to your ultrasound scheduled for tomorrow.  Best,  Marny Lowenstein, MD, MS FAMILY MEDICINE RESIDENT - PGY3 08/31/2019 3:50 PM

## 2019-08-31 NOTE — Progress Notes (Signed)
Raylea Adcox is a 33 y.o. G3P2002 at [redacted]w[redacted]d here for routine follow up.  She reports no vaginal bleeding, no leakage of fluid, no abdominal cramps or contractions.  Baby is moving around normally.  Due to language barrier, an interpreter was present during the history-taking and subsequent discussion (and for part of the physical exam) with this patient.  See flow sheet for details.  A/P: Pregnancy at [redacted]w[redacted]d.  Doing well.   Pregnancy issues include the following  GBS bacteruia Positive urine culture in 1st trimester  AMA Patient declined any genetic testing earlier in pregnancy. Fetal Pyelectisis Seen on ultrasound on 18 weeks 4 days.  Patient has scheduled follow-up ultrasound at 32 weeks.  She has it for tomorrow morning.  Infant feeding choice: Breast feeding Contraception choice: Nexplanon Infant circumcision desired: no  Tdap was not given today. Rx was given at last OB appointment.   PHQ9 was not done today. Consider repeating at next appointment.  Cervical Swab for GC/Chlamydia not done today as was obtained on 10/15. Consider repeating at next appointment for late trimester screen.   Preterm labor and fetal movement precautions reviewed. Safe sleep discussed. Follow up 2 weeks.

## 2019-09-22 ENCOUNTER — Other Ambulatory Visit: Payer: Self-pay

## 2019-09-22 ENCOUNTER — Encounter: Payer: Self-pay | Admitting: Family Medicine

## 2019-09-22 ENCOUNTER — Ambulatory Visit (INDEPENDENT_AMBULATORY_CARE_PROVIDER_SITE_OTHER): Payer: Self-pay | Admitting: Family Medicine

## 2019-09-22 DIAGNOSIS — Z3483 Encounter for supervision of other normal pregnancy, third trimester: Secondary | ICD-10-CM

## 2019-09-22 DIAGNOSIS — R8271 Bacteriuria: Secondary | ICD-10-CM

## 2019-09-22 DIAGNOSIS — R9389 Abnormal findings on diagnostic imaging of other specified body structures: Secondary | ICD-10-CM

## 2019-09-22 NOTE — Patient Instructions (Addendum)
It was wonderful to see you today.  Thank you for choosing [facility].   Please call [phone] with any questions about today's appointment.  Please be sure to schedule follow up at the front  desk before you leave today.   Dorris Singh, MD  Family Medicine     El tercer trimestre comprende desde la [id number] hasta la [id number] (desde el mes7 hasta el mes9). En este trimestre, el beb en gestacin (feto) crece muy rpidamente. Hacia el final del noveno mes, el beb en gestacin mide alrededor de 20pulgadas (45cm) de largo. Pesa entre 6y 10libras [phone]). Siga estas indicaciones en su casa: Medicamentos  [employer] de venta libre y los recetados solamente como se lo haya indicado el mdico. Algunos medicamentos son seguros para tomar durante el Media planner y otros no lo son.  Tome vitaminas prenatales que contengan por lo menos [id number] (?g) de cido flico.  Si tiene dificultad para mover el intestino (estreimiento), tome un medicamento para ablandar las heces (laxante) si su mdico se lo autoriza. Comida y bebida   Ingiera alimentos saludables de [place] regular.  No coma carne cruda ni quesos sin cocinar.  Si obtiene poca cantidad de calcio de los alimentos que ingiere, consulte a su mdico sobre la posibilidad de tomar un suplemento diario de calcio.  La ingesta diaria de cuatro o cinco comidas pequeas en lugar de tres comidas abundantes.  Evite el consumo de alimentos ricos en grasas y azcares, como los alimentos fritos y los dulces.  Para evitar el estreimiento: ? Consuma alimentos ricos en fibra, como frutas y verduras frescas, cereales integrales y frijoles. ? Beba suficiente lquido para mantener el pis (orina) claro o de color amarillo plido. Actividad  Haga ejercicios solamente como se lo haya indicado el mdico. Interrumpa la actividad fsica si comienza a tener calambres.  No levante objetos pesados, use  zapatos de tacones bajos y sintese derecha.  No haga ejercicio si hace demasiado calor, hay demasiada humedad o se encuentra en un lugar de mucha altura (altitud alta).  Puede continuar teniendo [employer], a menos que el mdico le indique lo contrario. Alivio del dolor y del Tree surgeon  Use un sostn que le brinde buen soporte si sus mamas estn sensibles.  Haga pausas frecuentes y descanse con las piernas levantadas si tiene calambres en las piernas o dolor en la zona lumbar.  Dese baos de asiento con agua tibia para Best boy o las molestias causadas por las hemorroides. Use una crema para las hemorroides si el mdico la autoriza.  Si desarrolla venas hinchadas y abultadas (vrices) en las piernas: ? Use medias de compresin o medias de descanso como se lo haya indicado el mdico. ? Levante (eleve) los pies durante 55minutos, 3 o 4veces por Training and development officer. ? Limite el consumo de sal en sus alimentos. Seguridad  [employer] cinturn de seguridad cuando conduzca.  Haga una lista de los nmeros de telfono de Freight forwarder, que [employer] nmeros de telfono de familiares, amigos, [facility], as como los departamentos de polica y bomberos. Preparacin para la llegada del beb Para prepararse para la llegada de su beb:  Tome clases prenatales.  Practique ir [employer] al hospital.  [facility] y recorra el rea de maternidad.  Hable en su trabajo acerca de tomar licencia cuando llegue el beb.  Prepare el bolso que llevar al hospital.  Prepare la habitacin del beb.  Concurra a los controles mdicos.  Compre un asiento de  seguridad [place]ette [place] atrs para llevar al beb en el automvil. Aprenda cmo instalarlo en el auto. Instrucciones generales  No se d baos de inmersin en agua caliente, baos turcos ni saunas.  No consuma ningn producto que contenga nicotina o tabaco, como cigarrillos y Administrator, Civil Service. Si necesita ayuda para dejar de  fumar, consulte al mdico.  No beba alcohol.  No se haga duchas vaginales ni use tampones o toallas higinicas perfumadas.  No mantenga las piernas cruzadas durante [place].  No haga viajes de larga distancia, excepto si es obligatorio. Hgalos solamente si su mdico la autoriza.  Visite a su dentista si no lo ha Occupational hygienist. Use un cepillo de cerdas suaves para cepillarse los dientes. Psese el hilo dental con suavidad.  Evite el contacto con las bandejas sanitarias de los gatos y la tierra que estos animales usan. Estos elementos contienen bacterias que pueden causar defectos congnitos al beb y la posible prdida del beb (aborto espontneo) o la muerte fetal.  Concurra a todas las visitas prenatales como se lo haya indicado el mdico. Esto es importante. Comunquese con un mdico si:  No est segura de si est en trabajo de parto o si ha roto la bolsa de las aguas.  Tiene mareos.  Tiene clicos leves o siente presin en la parte baja del vientre.  Sufre un dolor persistente en el abdomen.  Sigue teniendo [employer], vomita o tiene heces lquidas.  Advierte un lquido con olor ftido que proviene de la vagina.  Siente dolor al [employer]. Solicite ayuda de inmediato si:  Tiene fiebre.  Tiene una prdida de lquido por la vagina.  Tiene sangrado o pequeas prdidas vaginales.  Siente dolor intenso o clicos en el abdomen.  Aumenta o baja de peso rpidamente.  Tiene dificultades para recuperar el aliento y siente dolor en el pecho.  Sbitamente se le hinchan mucho el rostro, las [place], los tobillos, los pies o las piernas.  No ha sentido los movimientos del beb durante Georgianne Fick.  Siente un dolor de cabeza intenso que no se alivia con medicamentos.  Tiene dificultad para ver.  Tiene prdida de lquido o Special educational needs teacher chorro de lquido de la vagina antes de estar en la semana 37.  Tiene espasmos abdominales (contracciones) regulares antes de  estar en la semana 37. Resumen  El tercer trimestre comprende desde la [employer] la semana40 (desde el mes7 hasta el mes9). Esta es la poca en que el beb en gestacin crece muy rpidamente.  Siga los consejos del mdico con respecto a los medicamentos, la alimentacin y [place].  Preprese para la llegada del beb tomando las clases prenatales, preparando todo lo que necesitar el beb, arreglando la habitacin del beb y concurriendo a los controles mdicos.  Solicite ayuda de inmediato si tiene sangrado por la vagina, siente dolor en el pecho o tiene dificultad para respirar, o si no ha sentido que su beb se mueve en el transcurso de ms de [employer]. Esta informacin no tiene Theme park manager el consejo del mdico. Asegrese de hacerle al mdico cualquier pregunta que tenga. Document Released: 06/08/2013 Document Revised: 05/11/2017 Document Reviewed: 05/11/2017 Elsevier Patient Education  2020 [employer].

## 2019-09-22 NOTE — Progress Notes (Signed)
April Taylor is a 33 y.o. G3P2002 at [redacted]w[redacted]d here for routine follow up. She is dated by LMP.  She reports no complaints.  She reports she is getting ready for her baby and her younger child is excited.  She reports she has a car seat ready also has a crib.  She plans to breast-feed.  She breast-fed her first child for a year and 3 months.  She reports her received a tetanus vaccine.  The results of her ultrasound which showed pyelectasis were discussed with her at length.  She see flow sheet for details.  The patient speaks Spanish as their primary language.  An interpreter was used for the entire visit.    A/P: Pregnancy at [redacted]w[redacted]d.  Doing well.   Dating reviewed, dating tab is correct Fetal heart tones Appropriate Fundal height within expected range.  Pregnancy issues include   Fetal pyelectasis ultrasound at 31 weeks and 5 days demonstrated persistent right-sided fetal pyelectasis.  Discussed with patient at length today.  Following joint SMFM and Pediatric guidelines, infant will need renal ultrasound at in first month of life (after 48 hours) and again 1-6 months later.    GBS bacteriuria  Will need PCN at time of delivery. No need for GBS swab as known positive.    Problem list updated: Yes The patient does not have a history of HSV and valacyclovir is not at this time. (Start at 36w if history of genital HSV).  The patient does not have a history of Cesarean delivery and no appointment with Dr. Kennon Rounds is indicated.   Infant feeding choice: Breastfeeding Contraception choice: Nexplanon  Infant circumcision desired no  Influenza vaccine previously administered.   Tdap was not given today.  Childbirth and education classes were offered. Pregnancy education regarding benefits of breastfeeding, contraception, fetal growth, expected weight gain, and safe infant sleep were discussed.  Preterm labor and fetal movement  precautions reviewed. Weekly Ob visits scheduled until delivery over holiday.   Follow up 2 weeks.

## 2019-09-30 ENCOUNTER — Other Ambulatory Visit: Payer: Self-pay

## 2019-09-30 ENCOUNTER — Other Ambulatory Visit (HOSPITAL_COMMUNITY)
Admission: RE | Admit: 2019-09-30 | Discharge: 2019-09-30 | Disposition: A | Discharge status: Home / Self Care | Payer: Self-pay | Source: Ambulatory Visit | Attending: Family Medicine | Admitting: Family Medicine

## 2019-09-30 ENCOUNTER — Ambulatory Visit (INDEPENDENT_AMBULATORY_CARE_PROVIDER_SITE_OTHER): Payer: Self-pay | Admitting: Student in an Organized Health Care Education/Training Program

## 2019-09-30 VITALS — BP 115/80 | HR 81 | Wt 154.8 lb

## 2019-09-30 DIAGNOSIS — Z3483 Encounter for supervision of other normal pregnancy, third trimester: Secondary | ICD-10-CM | POA: Insufficient documentation

## 2019-09-30 NOTE — Progress Notes (Signed)
  Patient Name: April Taylor Date of Birth: 12/19/1985 Date of Visit: 09/30/19 PCP: Patriciaann Clan, DO  Chief Complaint: prenatal care  Subjective: April Taylor is a pleasant G3P2002 at  [redacted]w[redacted]d dated by LMP. She has no unusual complaints today. Reports good fetal movement. Denies contractions, loss of fluid, or bleeding.   ROS:  Review of Systems  Eyes: Negative for blurred vision.  Respiratory: Negative for cough.   Cardiovascular: Negative for chest pain and leg swelling.  Gastrointestinal: Negative for heartburn.  Genitourinary: Negative for dysuria.   I have reviewed the patient's medical, surgical, family, and social history as appropriate.   Pertinent PMH: Possible erroneous history of herpes labialis 2014, negative HSV labs 2011 and patient very strongly denies having any current or past lesions on genital area. Denies ever knowing of a history of or being told she has genital herpes.  Has never taking medication for this.  Prior Obstetric History: 2 prior vaginal deliveries  Complications of Current Pregnancy: right-sided fetal pyelectasis, GBS bacteriuria.   Objective: Blood pressure 115/80, pulse 81, weight 154 lb 12.8 oz (70.2 kg), last menstrual period 01/20/2019.  General: NAD, pleasant, able to participate in exam Pelvic: negative for external lesions or abnormalities. Moderate mucoid, clear discharge. Cervix approximately 1cm and thick, ballotable.  Fundal height 37, FHR 135 Extremities: no edema or cyanosis. WWP. Skin: warm and dry, no rashes noted Neuro: alert and oriented x4, no focal deficits Psych: Normal affect and mood   A/P: April Taylor was seen today for routine prenatal visit and excessive gas. Diagnoses and all orders for this visit:  Encounter for supervision of other normal pregnancy in third trimester -     Cervicovaginal ancillary only- GC/chlamydia - confirmed negative history for genital lesions.    Anticipatory  Guidance and Prenatal Education provided on the following topics: - Preterm labor signs - Reasons to present to MAU - Nutrition in pregnancy - Contraception postpartum - Breastfeed - Safe sleep for infant   Doristine Mango, DO PGY-2 FM resident

## 2019-09-30 NOTE — Patient Instructions (Signed)
It was a pleasure to see you today!  To summarize our discussion for this visit:  We checked for gonorrhea and chlamydia today.  We will discuss those results at your next appointment in 1 week.  You are approximately 1 cm dilated today.  Please return to our clinic to see [place] in 1 week.  Call the clinic at [phone] if your symptoms worsen or you have any concerns.   Thank you for allowing me to take part in your care,  Dr. Jamelle Rushing   Parto vaginal Vaginal Delivery  Parto vaginal significa que usted da a luz empujando al beb fuera del canal del parto (vagina). Un equipo de proveedores de atencin mdica la ayudar antes, durante y despus del parto vaginal. Las experiencias de los nacimientos son nicas para todas las [place], y Sports administrator y las experiencias de nacimiento varan segn dnde elija dar a luz. Ladell Heads ocurrir cuando llegue al centro de [place] o al hospital? Pollyann Savoy que se inicie el [place] de parto y haya sido admitida en el hospital o centro de parto, el mdico podr hacer lo siguiente:  Revisar sus antecedentes de Psychiatrist y cualquier inquietud que usted pueda tener.  Colocarle una va intravenosa en una de las venas. Esto se podr usar para administrarle lquidos y medicamentos.  Verificar su presin arterial, pulso, temperatura y frecuencia cardaca (signos vitales).  Verificar si la bolsa de agua (saco amnitico) se ha roto (ruptura).  Hablar con usted sobre su plan de nacimiento y Chiropractor las opciones para Human resources officer. Monitoreo Su mdico puede monitorear las contracciones (monitoreo uterino) y la frecuencia cardaca del beb (monitoreo fetal). Es posible que el monitoreo se necesite realizar:  Con frecuencia, pero no continuamente (intermitentemente).  Todo el tiempo o durante largos perodos a la vez (continuamente). El monitoreo continuo puede ser necesario si: ? Est recibiendo determinados medicamentos, tales como medicamentos para  Engineer, materials o para hacer que las contracciones sean ms fuertes. ? Tiene complicaciones durante el embarazo o el [place] de [place]. El monitoreo se puede realizar:  Al colocar un estetoscopio especial o un dispositivo manual de monitoreo en el abdomen o verificar los latidos cardacos del beb y comprobar las contracciones.  Al colocar monitores en el abdomen (monitores externos) para Passenger transport manager los latidos cardacos del beb y la frecuencia y duracin de las contracciones.  Al colocar monitores dentro del tero a travs de la vagina (monitores internos) para Passenger transport manager los latidos cardacos del beb y la frecuencia, duracin y fuerza de sus contracciones. Segn el tipo de monitor, Insurance claims handler en el tero o en la cabeza del beb hasta el nacimiento.  Telemetra. Se trata de un tipo de monitoreo continuo que se puede Education officer, environmental con monitores externos o internos. En lugar de Hospital doctor en la cama, usted puede moverse durante Fish farm manager. Examen fsico Su mdico puede realizar exmenes fsicos frecuentes. Esto puede incluir lo siguiente:  Investment banker, operational cmo y dnde el beb est ubicado en el tero.  Verificar el cuello uterino para determinar: ? Si se est afinando o estirando (borrando). ? Si se est abriendo (dilatando). Qu sucede durante el [place] de parto y [place]?  El [place] de parto y el parto normales se dividen en tres etapas: Etapa 1  Esta es la etapa ms larga del trabajo de [place].  Esta etapa puede durar [employer].  Durante esta etapa, sentir contracciones. En general, las contracciones son leves, infrecuentes e irregulares al principio. Se hacen ms  fuertes, ms frecuentes (aproximadamente cada 2 o 3 minutos) y ms regulares a medida que avanza en esta etapa.  Esta etapa finaliza cuando el cuello uterino est completamente dilatado hasta 4 pulgadas (10cm) y completamente borrado. Etapa 2  Esta etapa comienza una vez que el cuello uterino est  totalmente borrado y dilatado, y dura hasta el nacimiento del beb.  Esta etapa puede durar de 20 minutos a 2 horas.  Esta es la etapa en la que va a sentir ganas de pujar al beb fuera de la vagina.  Puede sentir un dolor urente y por estiramiento, especialmente cuando la parte ms ancha de la cabeza del beb pasa a travs de la abertura vaginal (coronacin).  Una vez que el beb nace, el cordn umbilical se pinzar y se cortar. Esto ocurre por lo general despus de un perodo de 1 a 2 minutos despus del parto.  Colocarn al beb sobre su pecho desnudo (contacto piel con piel) en una posicin erguida y [place] con Josefine Class abrigada. Observe al beb para detectar seales de hambre, como el reflejo de bsqueda o succin, y acrquelo al pecho para su primera alimentacin. Etapa 3  Esta etapa comienza inmediatamente despus del nacimiento del beb y finaliza despus de la expulsin de la placenta.  Esta etapa puede durar de 5 a 30 minutos.  Despus del nacimiento del beb, puede sentir contracciones cuando el cuerpo expulsa la placenta y el tero se contrae para Publishing copy. Qu puedo esperar despus del [place] de parto y [place]?  Una vez que termine el trabajo de [place], se los controlar a usted y al beb atentamente para Best boy la seguridad de que ambos estn sanos y listos para ir a Holiday representative. Su equipo de atencin Building surveyor cmo cuidarse y cuidar a su beb.  Usted y el beb permanecern en la misma habitacin (cohabitacin) durante su estada en el hospital. Esto estimular una vinculacin temprana y Berdine Addison [place].  Puede seguir recibiendo lquidos o medicamentos por va intravenosa.  Se le controlar y Community education officer el tero con regularidad (masaje fndico).  Tendr algo de inflamacin y dolor en el abdomen, la vagina y la zona de la piel entre la abertura vaginal y el ano (perineo).  Si se le realiz una incisin cerca de la vagina (episiotoma) o si ha  tenido Theme park manager parto, podran indicarle que se coloque compresas fras sobre la episiotoma o Psychiatrist. Esto ayuda a Best boy y la hinchazn.  Es posible que le den una botella rociadora para que use cuando vaya al bao para higienizarse. Siga los pasos a continuacin para usar la botella rociadora: ? Antes de orinar, llene la botella rociadora con agua tibia. No use agua caliente. ? Despus de Garment/textile technologist, [place] an est sentada en el inodoro, use la botella rociadora para enjuagar el rea alrededor de la uretra y la abertura vaginal. Con esto podr limpiar cualquier rastro de orina y [place]. ? Llene la botella rociadora con agua limpia cada vez que vaya al bao.  Es normal tener hemorragia vaginal despus del [place]. Use un apsito sanitario para el sangrado vaginal y secrecin. Resumen  Parto vaginal significa que usted dar a luz empujando al beb fuera del canal del parto (vagina).  Su mdico puede monitorear las contracciones (monitoreo uterino) y la frecuencia cardaca del beb (monitoreo fetal).  Su mdico puede realizarle un examen fsico.  El trabajo de parto y el parto normales se dividen en tres etapas.  Una vez que termina el [place] de [place], se los controlar a usted y al beb atentamente hasta que estn listos para ir a casa. Esta informacin no tiene Theme park managercomo fin reemplazar el consejo del mdico. Asegrese de hacerle al mdico cualquier pregunta que tenga. Document Released: 09/18/2008 Document Revised: 12/16/2017 Document Reviewed: 12/16/2017 Elsevier Patient Education  2020 [employer]

## 2019-10-03 LAB — CERVICOVAGINAL ANCILLARY ONLY
Chlamydia: NEGATIVE
Comment: NEGATIVE
Comment: NORMAL
Neisseria Gonorrhea: NEGATIVE

## 2019-10-07 ENCOUNTER — Ambulatory Visit (INDEPENDENT_AMBULATORY_CARE_PROVIDER_SITE_OTHER): Payer: Self-pay | Admitting: Student in an Organized Health Care Education/Training Program

## 2019-10-07 ENCOUNTER — Other Ambulatory Visit: Payer: Self-pay

## 2019-10-07 DIAGNOSIS — Z3483 Encounter for supervision of other normal pregnancy, third trimester: Secondary | ICD-10-CM

## 2019-10-07 NOTE — Assessment & Plan Note (Addendum)
No concerns today. Weight gain 1lb since last visit.  Exam wnl. Addressed concerns and answered all questions.  Follow up in 1 week.

## 2019-10-07 NOTE — Patient Instructions (Signed)
It was a pleasure to see you today!  To summarize our discussion for this visit:  Your baby is growing well and the heart beat is at a healthy level. We saw that your baby is head down today.  Please come back in one week for follow up appointment   Call the clinic at [phone] if your symptoms worsen or you have any concerns.   Thank you for allowing me to take part in your care,  Dr. Jamelle Rushing   Signos y sntomas del April Taylor de parto Signs and Symptoms of Labor El trabajo de parto es el proceso natural del cuerpo por el cual se saca al beb, la placenta y el cordn umbilical del tero. Por lo general, el proceso del [place] de parto comienza cuando el embarazo ha llegado a su trmino, entre 37 y 40 semanas de Psychiatrist. Cmo sabr que estoy prxima a comenzar el trabajo de parto? A medida que el cuerpo se prepara para el trabajo de parto y el nacimiento del beb, puede notar los siguientes sntomas en las semanas y [place] anteriores al trabajo de parto propiamente dicho:  Un fuerte deseo de preparar su casa para recibir al nuevo beb. Esto se denomina anidacin. La anidacin puede ser un signo de que se est acercando el [place] de [place], y puede ocurrir varias semanas antes del nacimiento. La anidacin puede implicar limpiar y Chiropodist.  Una pequea cantidad de mucosidad espesa y con Pensions consultant de la vagina (aparicin normal de sangre o prdida del tapn mucoso). Esto puede suceder ms de una semana antes de que comience el [place] de [place], o puede ocurrir justo antes de que comience el [place] de parto a medida que el cuello uterino comienza a ensancharse (dilatarse). En algunas mujeres, el tapn [employer] entero de una sola vez. En otras, pueden salir pequeas partes del tapn mucoso de forma gradual durante varios das.  El beb se mueve (desciende) a la parte inferior de la pelvis para ponerse en posicin para el nacimiento (aligeramiento). Cuando esto sucede,  puede sentir ms presin en la vejiga y el hueso plvico, y menos presin en las costillas. Esto facilitar la respiracin. Tambin puede hacer que necesite orinar con ms frecuencia y que tenga problemas para hacer de vientre.  Tener "contracciones de Multimedia programmer" (contracciones de [employer]) que ocurren a Psychologist, occupational (espaciadas de modo desigual) con una diferencia de ms de 10 minutos. Esto tambin se denomina trabajo de [id number] [address]. Las contracciones de [place] de parto falso son comunes luego del ejercicio o la actividad sexual, y se detendrn si cambia de posicin, descansa o toma lquidos. Estas contracciones son generalmente leves y no se tornan ms fuertes con el tiempo. Pueden sentirse como lo siguiente: ? Un dolor de espalda. ? Calambres leves, similares a los [employer]. ? Tirantez o presin en el abdomen. Otros sntomas tempranos de que el trabajo de parto puede comenzar pronto incluyen:  Nuseas o prdida del apetito.  Diarrea.  Un repentino estallido de energa o sentirse muy cansada.  Cambios en el humor.  Problemas para dormir. Cmo sabr cuando ha comenzado el trabajo de parto? Los signos de que ha comenzado el trabajo de parto verdadero pueden incluir:  Contracciones a intervalos regulares (espaciadas de modo regular) que se incrementan en intensidad. Esto puede sentirse como presin o estrechamiento intenso en el abdomen, que se desplaza hacia la espalda. ? Las contracciones pueden sentirse tambin como dolor rtmico en la parte superior de los  muslos y la espalda que va y viene a intervalos regulares. ? Para las [employer] primerizas, este cambio en intensidad de las contracciones ocurre generalmente a un ritmo ms gradual. ? Las mujeres que ya han sido madres pueden notar una progresin ms rpida de los cambios de las contracciones.  Una sensacin de presin en el rea vaginal.  Ruptura de la bolsa (ruptura de las [place]). Es cuando el saco de  lquido que rodea al beb se rompe. Cuando esto sucede, notar que [employer] lquido de la vagina. Este puede ser claro o estar manchado de [place]. Generalmente el trabajo de parto comienza 24 horas despus de la ruptura de [place], [place] puede tomar ms Oceanographer. ? Algunas mujeres notan esto como un chorro de lquido. ? Otras notan que la ropa interior se moja repetidas veces. Siga estas indicaciones en su casa:   Cuando comience el trabajo de parto o si rompe bolsa, llame al mdico o a la lnea de atencin de enfermera. Ellos, en funcin de su situacin, determinarn cundo debe ir a Geophysical data processor.  Si entra en trabajo de parto temprano, es posible que pueda descansar y [employer] sntomas en su casa. Algunas estrategias para probar en su casa incluyen: ? Tcnicas de respiracin y relajacin. ? Tomar una ducha o un bao de inmersin tibios. ? Conservation officer, nature. ? Usar una almohadilla trmica en la espalda para Best boy. Si se le indica que use calor:  Coloque una toalla entre la piel y la fuente de Freight forwarder.  Aplique el calor durante 20 a 31minutos.  Retire la fuente de calor si la piel se pone de color rojo brillante. Esto es muy importante si no puede Education officer, environmental, calor o fro. Puede correr un riesgo mayor de sufrir quemaduras. Solicite ayuda de inmediato si:  Tiene contracciones dolorosas y regulares cada 5 minutos o menos.  El trabajo de parto comienza antes de que se cumplan las 31 semanas de [place].  Tiene fiebre.  Siente un dolor de cabeza intenso que no se [place].  Elimina cogulos de sangre de color rojo brillante por la vagina.  No siente que el beb se mueva.  Experimenta la aparicin repentina de: ? Dolor de cabeza intenso con problemas de la visin. ? Nuseas, vmitos o diarrea. ? Dolor en el pecho o falta de aire. Estos sntomas pueden Sales executive. Si el mdico recomienda que vaya al hospital o al centro de nacimientos donde va a dar a  luz, no conduzca usted misma. Pdale a otra persona que conduzca o llame a los servicios de Freight forwarder (47 en los [place]idos) Resumen  El trabajo de parto es el proceso natural del cuerpo por el cual se saca al beb, la placenta y el cordn umbilical del tero.  Por lo general, el proceso del [place] de parto comienza cuando el embarazo ha llegado a su trmino, entre 79 y 49 semanas de Media planner.  Cuando comience el trabajo de parto o si rompe bolsa, llame al mdico o a la lnea de atencin de enfermera. Ellos, en funcin de su situacin, determinarn cundo debe ir a Geophysical data processor. Esta informacin no tiene Marine scientist el consejo del mdico. Asegrese de hacerle al mdico cualquier pregunta que tenga. Document Released: 10/21/2015 Document Revised: 07/06/2017 Document Reviewed: 07/01/2017 Elsevier Patient Education  2020 [employer].

## 2019-10-07 NOTE — Progress Notes (Signed)
  Patient Name: April Taylor Date of Birth: 04-23-1986 Date of Visit: 10/07/19 PCP: Patriciaann Clan, DO  Chief Complaint: prenatal care  Subjective: Interpreter used for visit  April Taylor is a pleasant G3P2002 at  [redacted]w[redacted]d dated by LMP. She has no unusual complaints today. Reports good fetal movement. Denies contractions, loss of fluid, or bleeding.   Has questions about our visit about HSV. I reassured patient that her GC/chlamydia were negative and reconfirmed that she has never had any genital lesions or a known history of HSV.  Otherwise, she has no questions or concerns today.  ROS:  Review of Systems  Constitutional: Negative for fever.  Eyes: Negative.   Cardiovascular: Negative for leg swelling.  Gastrointestinal: Positive for abdominal pain (when she sits or stands (changes positions) she has some abdominal pain)). Negative for nausea and vomiting.  Neurological: Negative for headaches.    I have reviewed the patient's medical, surgical, family, and social history as appropriate.   Prior Obstetric History: 2 prior vaginal deliveries  Complications of Current Pregnancy:fetal pyelectasis: infant will need renal ultrasound at in first month of life (after 48 hours) and again 1-6 months later.  GBS bacteruria, language barrier   Objective: Blood pressure 127/80, pulse 92, weight 156 lb 9.6 oz (71 kg), last menstrual period 01/20/2019.  General: NAD, pleasant, able to participate in exam Abdomen: fundal height about 37cm, FHR ~125bpm Extremities: no edema or cyanosis. WWP. Skin: warm and dry, no rashes noted Neuro: alert and oriented x4, no focal deficits Psych: Normal affect and mood  [place]: confirmed vertex position  Assessment/Plan: April Taylor is [redacted]w[redacted]d here for prenatal visit with the following abnormalities: none  Encounter for supervision of other normal pregnancy in third trimester No concerns today. Weight gain 1lb since last  visit.  Exam wnl. Addressed concerns and answered all questions.  Follow up in 1 week.   Anticipatory Guidance and Prenatal Education provided on the following topics: - Preterm labor signs  - Reasons to present to MAU - Nutrition in pregnancy - Contraception postpartum - Breastfeed - Safe sleep for infant   Doristine Mango, DO PGY-2 [facility]

## 2019-10-12 ENCOUNTER — Other Ambulatory Visit: Payer: Self-pay

## 2019-10-12 ENCOUNTER — Ambulatory Visit (INDEPENDENT_AMBULATORY_CARE_PROVIDER_SITE_OTHER): Payer: Self-pay | Admitting: Family Medicine

## 2019-10-12 VITALS — BP 112/72 | HR 75 | Wt 157.0 lb

## 2019-10-12 DIAGNOSIS — Z3483 Encounter for supervision of other normal pregnancy, third trimester: Secondary | ICD-10-CM

## 2019-10-12 NOTE — Progress Notes (Signed)
April Taylor is a 33 y.o. G3P2 at [redacted]w[redacted]d for routine follow up.  She reports no loss of fluid, no vaginal bleeding, no right upper quadrant pain, no headaches, no swelling, baby is moving well and she is experiencing no new cramping. I have reviewed the patient's medical, surgical, family, and social history as appropriate.   Prior Obstetric History: 2 prior vaginal deliveries  Complications of Current Pregnancy:  - fetal pyelectasis: infant will need renal ultrasound at in first month of life (after 48 hours) and again 1-6 months later.   -GBS bacteruria -language barrier   Objective: Vitals:   10/12/19 0922  BP: 112/72  Pulse: 75    General: NAD, pleasant Abdomen: fundal height about 37cm, FHR ~137bpm Extremities: no edema or cyanosis. WWP. Skin: warm and dry, no rashes noted Neuro: alert and oriented x4, no focal deficits Psych: Normal affect and mood  [place]: confirmed vertex position at last visit on 12/18  Assessment/Plan: April Taylor is [redacted]w[redacted]d here for prenatal visit with the following abnormalities: none  Encounter for supervision of other normal pregnancy in third trimester No concerns today. Weight gain 1lb since last visit.  Exam wnl. Addressed concerns and answered all questions.   fetal pyelectasis: infant will need renal ultrasound at in first month of life (after 48 hours) and again 1-6 months later.    GBS bacteruria: Will need antibiotics during delivery  language barrier-Spanish interpreter used during this visit  Follow up in 1 week.  Anticipatory Guidance and Prenatal Education provided on the following topics: - Preterm labor signs  - Reasons to present to MAU - Nutrition in pregnancy - Contraception postpartum - Breastfeed - Safe sleep for infant    [place] Awilda Covin, [place] PGY-3, [place]

## 2019-10-12 NOTE — Patient Instructions (Addendum)
Thank you for coming to see me today. It was a pleasure! Today we talked about:   If you have any contractions which occur 7-10 minutes apart, vaginal bleeding, fluid leaking, or are worried that baby is not moving well, go immediately to [facility] to be evaluated.  [email or web address].org has a lot of good information on birth control options.   Please follow-up with in 1 week or sooner as needed.  If you have any questions or concerns, please do not hesitate to call the office at [phone].  Take Care,   [place] Adah Stoneberg, DO   Tercer trimestre de Psychiatrist Third Trimester of Pregnancy  El tercer trimestre comprende desde la [employer] la semana40 (desde el mes7 hasta el mes9). En este trimestre, el beb en gestacin (feto) crece muy rpidamente. Hacia el final del noveno mes, el beb en gestacin mide alrededor de 20pulgadas (45cm) de largo. Pesa entre 6y 10libras [phone]). Siga estas indicaciones en su casa: Medicamentos  [employer] de venta libre y los recetados solamente como se lo haya indicado el mdico. Algunos medicamentos son seguros para tomar durante el Psychiatrist y otros no lo son.  Tome vitaminas prenatales que contengan por lo menos (?g) de cido flico.  Si tiene dificultad para mover el intestino (estreimiento), tome un medicamento para ablandar las heces (laxante) si su mdico se lo autoriza. Comida y bebida   Ingiera alimentos saludables de Rains regular.  No coma carne cruda ni quesos sin cocinar.  Si obtiene poca cantidad de calcio de los alimentos que ingiere, consulte a su mdico sobre la posibilidad de tomar un suplemento diario de calcio.  La ingesta diaria de cuatro o cinco comidas pequeas en lugar de tres comidas abundantes.  Evite el consumo de alimentos ricos en grasas y azcares, como los alimentos fritos y los dulces.  Para evitar el estreimiento: ? Consuma alimentos ricos en fibra, como frutas y  verduras frescas, cereales integrales y frijoles. ? Beba suficiente lquido para mantener el pis (orina) claro o de color amarillo plido. Actividad  Haga ejercicios solamente como se lo haya indicado el mdico. Interrumpa la actividad fsica si comienza a tener calambres.  No levante objetos pesados, use zapatos de tacones bajos y sintese derecha.  No haga ejercicio si hace demasiado calor, hay demasiada humedad o se encuentra en un lugar de mucha altura (altitud alta).  Puede continuar teniendo [employer], a menos que el mdico le indique lo contrario. Alivio del dolor y del Dentist  Use un sostn que le brinde buen soporte si sus mamas estn sensibles.  Haga pausas frecuentes y descanse con las piernas levantadas si tiene calambres en las piernas o dolor en la zona lumbar.  Dese baos de asiento con agua tibia para Engineer, materials o las molestias causadas por las hemorroides. Use una crema para las hemorroides si el mdico la autoriza.  Si desarrolla venas hinchadas y abultadas (vrices) en las piernas: ? Use medias de compresin o medias de descanso como se lo haya indicado el mdico. ? Levante (eleve) los pies durante , 3 o 4veces por Futures trader. ? Limite el consumo de sal en sus alimentos. Seguridad  [employer] cinturn de seguridad cuando conduzca.  Haga una lista de los nmeros de telfono de Associate Professor, que [employer] nmeros de telfono de familiares, amigos, [facility], as como los departamentos de polica y bomberos. Preparacin para la llegada del beb Para prepararse para la llegada de su beb:  Tome clases  prenatales.  Practique ir [employer] al hospital.  [facility] y recorra el rea de maternidad.  Hable en su trabajo acerca de tomar licencia cuando llegue el beb.  Prepare el bolso que llevar al hospital.  Prepare la habitacin del beb.  Concurra a los controles mdicos.  Compre un asiento de seguridad [employer] atrs  para llevar al beb en el automvil. Aprenda cmo instalarlo en el auto. Instrucciones generales  No se d baos de inmersin en agua caliente, baos turcos ni saunas.  No consuma ningn producto que contenga nicotina o tabaco, como cigarrillos y Psychologist, sport and exercise. Si necesita ayuda para dejar de fumar, consulte al mdico.  No beba alcohol.  No se haga duchas vaginales ni use tampones o toallas higinicas perfumadas.  No mantenga las piernas cruzadas durante mucho tiempo.  No haga viajes de larga distancia, excepto si es obligatorio. Hgalos solamente si su mdico la autoriza.  Visite a su dentista si no lo ha Quarry manager. Use un cepillo de cerdas suaves para cepillarse los dientes. Psese el hilo dental con suavidad.  Evite el contacto con las bandejas sanitarias de los gatos y la tierra que estos animales usan. Estos elementos contienen bacterias que pueden causar defectos congnitos al beb y la posible prdida del beb (aborto espontneo) o la muerte fetal.  Concurra a todas las visitas prenatales como se lo haya indicado el mdico. Esto es importante. Comunquese con un mdico si:  No est segura de si est en trabajo de parto o si ha roto la bolsa de las aguas.  Tiene mareos.  Tiene clicos leves o siente presin en la parte baja del vientre.  Sufre un dolor persistente en el abdomen.  Sigue teniendo [employer], vomita o tiene heces lquidas.  Advierte un lquido con olor ftido que proviene de la vagina.  Siente dolor al [employer]. Solicite ayuda de inmediato si:  Tiene fiebre.  Tiene una prdida de lquido por la vagina.  Tiene sangrado o pequeas prdidas vaginales.  Siente dolor intenso o clicos en el abdomen.  Aumenta o baja de peso rpidamente.  Tiene dificultades para recuperar el aliento y siente dolor en el pecho.  Sbitamente se le hinchan mucho el rostro, las Simms, los tobillos, los pies o las piernas.  No ha sentido los  movimientos del beb durante Leone Brand.  Siente un dolor de cabeza intenso que no se alivia con medicamentos.  Tiene dificultad para ver.  Tiene prdida de lquido o Chief Financial Officer chorro de lquido de la vagina antes de estar en la semana 37.  Tiene espasmos abdominales (contracciones) regulares antes de estar en la semana 39. Resumen  El tercer trimestre comprende desde la [employer] la [id number] (desde el mes7 hasta el mes9). Esta es la poca en que el beb en gestacin crece muy rpidamente.  Siga los consejos del mdico con respecto a los medicamentos, la alimentacin y [place].  Preprese para la llegada del beb tomando las clases prenatales, preparando todo lo que necesitar el beb, arreglando la habitacin del beb y concurriendo a los controles mdicos.  Solicite ayuda de inmediato si tiene sangrado por la vagina, siente dolor en el pecho o tiene dificultad para respirar, o si no ha sentido que su beb se mueve en el transcurso de ms de [employer]. Esta informacin no tiene Marine scientist el consejo del mdico. Asegrese de hacerle al mdico cualquier pregunta que tenga. Document Released: 06/08/2013 Document Revised: 05/11/2017 Document Reviewed: 05/11/2017  Elsevier Patient Education  [employer] [place] [employer].

## 2019-10-18 ENCOUNTER — Encounter: Payer: Self-pay | Admitting: Family Medicine

## 2019-10-19 ENCOUNTER — Ambulatory Visit (INDEPENDENT_AMBULATORY_CARE_PROVIDER_SITE_OTHER): Payer: Self-pay | Admitting: Family Medicine

## 2019-10-19 ENCOUNTER — Other Ambulatory Visit: Payer: Self-pay

## 2019-10-19 VITALS — BP 122/80 | HR 84 | Wt 157.6 lb

## 2019-10-19 DIAGNOSIS — Z3483 Encounter for supervision of other normal pregnancy, third trimester: Secondary | ICD-10-CM

## 2019-10-19 NOTE — Progress Notes (Signed)
April Taylor is a 33 y.o. G 3 P 2002 at [redacted]w[redacted]d for routine follow up.  She reports no concerns.  No vaginal bleeding, no loss of fluid.  Occasional irregular contractions.  Has felt baby move recently..  BP 122/80   Pulse 84   Wt 157 lb 9.6 oz (71.5 kg)   LMP 01/20/2019 (Exact Date)   BMI 27.92 kg/m  Fetal heart rate was 154 bpm Fundal height was 38 cm  A/P: Pregnancy at [redacted]w[redacted]d.  Doing well.  Mom has no concerns. Pregnancy issues include  Fetal pyelectasis,-infant will need renal ultrasound between 48 hours and 30 days of life and again at 6 months.  GBS bacteriuria-patient will need antibiotics during delivery.  Adoptive mom in person translator was used  Follow-up 1 week, January 5 with Dr. Levon Hedger feeding choice breast and bottle Contraception choice IUD or Nexplanon Infant circumcision desired no GBS/GC/CZ results were reviewed today.   Labor precautions reviewed. Kick counts reviewed.

## 2019-10-19 NOTE — Patient Instructions (Signed)
Everything looks good today.  If you experience any of the following, please go to the [facility]: Vaginal bleeding, loss of fluid from the vagina, not feeling your baby move for a long time, contractions that are regular, strong and 10 minutes apart or sooner.  You have an appointment with [place] a week now.  Have a great day,  Clemetine Marker, MD

## 2019-10-21 NOTE — L&D Delivery Note (Addendum)
Delivery Note At 7:44 PM a viable and healthy female was delivered via Vaginal, Spontaneous (Presentation: Left Occiput Anterior).  1 nuchal cord, reduced. Placenta status: manually removed.  Cord: 3 vessels with the following complications: possible velamentous insertion vs avulsion.  Ancef 2g ordered s/p manual extraction of placenta.  Anesthesia: Epidural Episiotomy: None Lacerations:  None Suture Repair:None Est. Blood Loss (mL):  50  Mom to postpartum.  Baby to Nursery.  Shirlean Mylar 10/25/2019, 8:21 PM  OB FELLOW DELIVERY ATTESTATION  I was gloved and present for the delivery in its entirety, and I agree with the above resident's note. Patient presented to MAU for vaginal bleeding/early labor. Initial SVE 2/50/ballotable. Patient received Cytotec and Foley balloon. SROM occurred. Patient received epidural and progressed to complete. Dr. Adrian Blackwater called for avulsed cord/vilamentous insertion. Placenta manually removed by myself and uterine sweep thereafter without retained product noted.   Jerilynn Birkenhead, MD [facility] Family Medicine Fellow, [facility] for [employer], [facility]

## 2019-10-23 NOTE — Progress Notes (Addendum)
  Patient Name: April Taylor Date of Birth: September 25, 1986 Date of Visit: 10/25/19 PCP: Allayne Stack, DO Third Trimester Prenatal Visit   Chief Complaint: prenatal care  Subjective: April Taylor is a pleasant G3P2002 at  [redacted]w[redacted]d dated by LMP.She has no unusual complaints today.  She reports vaginal bleeding this AM. Denies discharge. She reports good fetal movement. She reports contractions.   Pregnancy complications include: Fetal pyelectasis  Ultrasound at 31 weeks and 5 days demonstrated persistent right-sided fetal pyelectasis.  Infant will need renal ultrasound at in first month of life (after 48 hours) and again 1-6 months later.    GBS bacteriuria   Will need PCN at time of delivery. No need for GBS swab as known positive.   ROS: per HPI.   I have reviewed the patient's medical, surgical, family, and social history as appropriate.   Labs reviewed: GC/Chlamydia negative, Hgb 13.2, HIV NR, RPR NR, 1hr GTT 203 but all other values in 3 hr test wnl so no GDM,  [place] reviewed: @[redacted]w[redacted]d , CWD, normal anatomy, cephalic presentation, anterior placental lie, 238.26g, 35.2% EFW. Mild bilateral renal pelvic fullness measuring 5 mm in AP dimension.  Recommended a follow-up exam at [redacted] weeks gestation Confirmed with [facility] via the telephone that patient had f/u on 09/01/19. 13/12/20 showed L kidney hydronephrosis 1cm. R kidney with 33mm  pelvic fullness. Will plan to fax over results to 11m today.    Vitals:   10/25/19 1014  BP: 122/84  Pulse: 95   Last Weight  Most recent update: 10/25/2019 10:14 AM   Weight  71.2 kg (157 lb)          Cervical exam: 2-3cm, 50%, ballotable   See flow sheet for exam.  Fetal heart tones documented in flow sheet  Fundal height documented in flow sheet   A/P: April Taylor is a pleasant G3P2002 at [redacted]w[redacted]d presenting for routine third trimester prenatal care. Overall she is doing well.   Anticipatory Guidance and Prenatal  Education provided on the following topics: . Dating reviewed, dating tab is correct . Fetal heart tones Appropriate . Fundal height within expected range.  . Fetal position confirmed Vertex using Leopold's .  [redacted]w[redacted]d Problem list updated: Yes  . GBS and gc/chlamydia testing results were reviewed today.  GBS positive  . Pregnancy education regarding labor, fetal movement,  benefits of breastfeeding, contraception, and safe infant sleep were discussed.  . Labor and fetal movement precautions reviewed. . Patient to continue PNV  Given that patient has some cervical change, now 2-3 cm at previous visit was finger tip per patient, and having what appears to be bloody show will send to MAU. Patient GBS positive so will need intrapartum abx during labor so more beneficial to be admitted earlier in active labor rather than late. Discussed with attending Dr. Marland Kitchen who agrees. Patient informed to go to MAU. Currently has a babysitter for her children. Patient worried that her husband is currently out of the country and is not sure if he will be allowed to enter L&D unit due to recent travel. I advised she inform MAU provider as they will better know rules of the unit.   Follow up on Friday, 10/28/19.

## 2019-10-24 ENCOUNTER — Telehealth: Payer: Self-pay

## 2019-10-24 NOTE — Telephone Encounter (Signed)
Spoke with pt through interpreter [place] ID# [phone]. Informed pt of her appt on 10/25/19. Also went over COVID screening questions. Aquilla Solian, CMA

## 2019-10-25 ENCOUNTER — Other Ambulatory Visit: Payer: Self-pay

## 2019-10-25 ENCOUNTER — Ambulatory Visit (INDEPENDENT_AMBULATORY_CARE_PROVIDER_SITE_OTHER): Payer: Medicaid Other | Admitting: Family Medicine

## 2019-10-25 ENCOUNTER — Inpatient Hospital Stay (HOSPITAL_COMMUNITY)
Admission: AD | Admit: 2019-10-25 | Discharge: 2019-10-27 | DRG: 806 | Disposition: A | Discharge status: Home / Self Care | Payer: Medicaid Other | Attending: Family Medicine | Admitting: Family Medicine

## 2019-10-25 ENCOUNTER — Inpatient Hospital Stay (HOSPITAL_COMMUNITY): Payer: Medicaid Other | Admitting: Anesthesiology

## 2019-10-25 ENCOUNTER — Encounter (HOSPITAL_COMMUNITY): Payer: Self-pay | Admitting: Family Medicine

## 2019-10-25 VITALS — BP 122/84 | HR 95 | Wt 157.0 lb

## 2019-10-25 DIAGNOSIS — Z20822 Contact with and (suspected) exposure to covid-19: Secondary | ICD-10-CM | POA: Diagnosis present

## 2019-10-25 DIAGNOSIS — O09529 Supervision of elderly multigravida, unspecified trimester: Secondary | ICD-10-CM

## 2019-10-25 DIAGNOSIS — R8271 Bacteriuria: Secondary | ICD-10-CM | POA: Diagnosis present

## 2019-10-25 DIAGNOSIS — O1414 Severe pre-eclampsia complicating childbirth: Principal | ICD-10-CM | POA: Diagnosis present

## 2019-10-25 DIAGNOSIS — O1410 Severe pre-eclampsia, unspecified trimester: Secondary | ICD-10-CM

## 2019-10-25 DIAGNOSIS — O1413 Severe pre-eclampsia, third trimester: Secondary | ICD-10-CM

## 2019-10-25 DIAGNOSIS — B001 Herpesviral vesicular dermatitis: Secondary | ICD-10-CM | POA: Diagnosis present

## 2019-10-25 DIAGNOSIS — O99824 Streptococcus B carrier state complicating childbirth: Secondary | ICD-10-CM

## 2019-10-25 DIAGNOSIS — Z3483 Encounter for supervision of other normal pregnancy, third trimester: Secondary | ICD-10-CM

## 2019-10-25 DIAGNOSIS — O43123 Velamentous insertion of umbilical cord, third trimester: Secondary | ICD-10-CM | POA: Diagnosis present

## 2019-10-25 DIAGNOSIS — O9852 Other viral diseases complicating childbirth: Secondary | ICD-10-CM | POA: Diagnosis present

## 2019-10-25 DIAGNOSIS — Z3A39 39 weeks gestation of pregnancy: Secondary | ICD-10-CM

## 2019-10-25 LAB — CBC
HCT: 39.8 % (ref 36.0–46.0)
HCT: 42.5 % (ref 36.0–46.0)
Hemoglobin: 13.7 g/dL (ref 12.0–15.0)
Hemoglobin: 14.4 g/dL (ref 12.0–15.0)
MCH: 30.2 pg (ref 26.0–34.0)
MCH: 30.4 pg (ref 26.0–34.0)
MCHC: 34.4 g/dL (ref 30.0–36.0)
MCHC: 33.9 g/dL (ref 30.0–36.0)
MCV: 87.7 fL (ref 80.0–100.0)
MCV: 89.7 fL (ref 80.0–100.0)
Platelets: 155 10*3/uL (ref 150–400)
Platelets: 175 10*3/uL (ref 150–400)
RBC: 4.54 MIL/uL (ref 3.87–5.11)
RBC: 4.74 MIL/uL (ref 3.87–5.11)
RDW: 13.2 % (ref 11.5–15.5)
RDW: 13.2 % (ref 11.5–15.5)
WBC: 8.5 10*3/uL (ref 4.0–10.5)
WBC: 18.2 10*3/uL — ABNORMAL HIGH (ref 4.0–10.5)
nRBC: 0 % (ref 0.0–0.2)
nRBC: 0 % (ref 0.0–0.2)

## 2019-10-25 LAB — COMPREHENSIVE METABOLIC PANEL
ALT: 9 U/L (ref 0–44)
AST: 17 U/L (ref 15–41)
Albumin: 2.9 g/dL — ABNORMAL LOW (ref 3.5–5.0)
Alkaline Phosphatase: 98 U/L (ref 38–126)
Anion gap: 10 (ref 5–15)
BUN: 10 mg/dL (ref 6–20)
CO2: 19 mmol/L — ABNORMAL LOW (ref 22–32)
Calcium: 9.4 mg/dL (ref 8.9–10.3)
Chloride: 106 mmol/L (ref 98–111)
Creatinine, Ser: 0.46 mg/dL (ref 0.44–1.00)
GFR calc Af Amer: >60 mL/min (ref >60)
GFR calc non Af Amer: >60 mL/min (ref >60)
Glucose, Bld: 75 mg/dL (ref 70–99)
Potassium: 3.8 mmol/L (ref 3.5–5.1)
Sodium: 135 mmol/L (ref 135–145)
Total Bilirubin: 0.5 mg/dL (ref 0.3–1.2)
Total Protein: 6.7 g/dL (ref 6.5–8.1)

## 2019-10-25 LAB — TYPE AND SCREEN
ABO/RH(D): A POS
Antibody Screen: NEGATIVE

## 2019-10-25 LAB — SARS CORONAVIRUS 2 (TAT 6-24 HRS): SARS Coronavirus 2: NEGATIVE

## 2019-10-25 LAB — ABO/RH: ABO/RH(D): A POS

## 2019-10-25 LAB — PROTEIN / CREATININE RATIO, URINE
Creatinine, Urine: 105 mg/dL
Protein Creatinine Ratio: 0.11 mg/mg{Cre} (ref 0.00–0.15)
Total Protein, Urine: 12 mg/dL

## 2019-10-25 MED ORDER — DIBUCAINE (PERIANAL) 1 % EX OINT: 1.0000 "application " | TOPICAL_OINTMENT | CUTANEOUS | Status: DC | PRN

## 2019-10-25 MED ORDER — ZOLPIDEM TARTRATE 5 MG PO TABS: 5.0000 mg | ORAL_TABLET | Freq: Every evening | ORAL | Status: DC | PRN

## 2019-10-25 MED ORDER — SODIUM CHLORIDE 0.9 % IV SOLN
5.0000 10*6.[IU] | Freq: Once | INTRAVENOUS | Status: AC
  Administered 2019-10-25: 5 10*6.[IU] via INTRAVENOUS
  Filled 2019-10-25: qty 5

## 2019-10-25 MED ORDER — LABETALOL HCL 5 MG/ML IV SOLN: 80.0000 mg | INTRAVENOUS | Status: DC | PRN

## 2019-10-25 MED ORDER — LACTATED RINGERS IV SOLN: INTRAVENOUS | Status: DC

## 2019-10-25 MED ORDER — PHENYLEPHRINE 40 MCG/ML (10ML) SYRINGE FOR IV PUSH (FOR BLOOD PRESSURE SUPPORT): 80.0000 ug | PREFILLED_SYRINGE | INTRAVENOUS | Status: DC | PRN

## 2019-10-25 MED ORDER — MAGNESIUM SULFATE 40 GM/1000ML IV SOLN
2.0000 g/h | INTRAVENOUS | Status: DC
  Filled 2019-10-25: qty 1000

## 2019-10-25 MED ORDER — LABETALOL HCL 5 MG/ML IV SOLN
INTRAVENOUS | Status: AC
  Administered 2019-10-25: 20 mg via INTRAVENOUS
  Filled 2019-10-25: qty 4

## 2019-10-25 MED ORDER — ONDANSETRON HCL 4 MG/2ML IJ SOLN: 4.0000 mg | INTRAMUSCULAR | Status: DC | PRN

## 2019-10-25 MED ORDER — CEFAZOLIN SODIUM-DEXTROSE 2-4 GM/100ML-% IV SOLN
2.0000 g | Freq: Once | INTRAVENOUS | Status: AC
  Administered 2019-10-25: 2 g via INTRAVENOUS
  Filled 2019-10-25: qty 100

## 2019-10-25 MED ORDER — LACTATED RINGERS IV SOLN: 500.0000 mL | Freq: Once | INTRAVENOUS | Status: DC

## 2019-10-25 MED ORDER — LACTATED RINGERS IV SOLN: 500.0000 mL | INTRAVENOUS | Status: DC | PRN

## 2019-10-25 MED ORDER — PHENYLEPHRINE 40 MCG/ML (10ML) SYRINGE FOR IV PUSH (FOR BLOOD PRESSURE SUPPORT)
80.0000 ug | PREFILLED_SYRINGE | INTRAVENOUS | Status: DC | PRN
  Administered 2019-10-25: 80 ug via INTRAVENOUS
  Filled 2019-10-25: qty 10

## 2019-10-25 MED ORDER — OXYCODONE-ACETAMINOPHEN 5-325 MG PO TABS: 1.0000 | ORAL_TABLET | ORAL | Status: DC | PRN

## 2019-10-25 MED ORDER — OXYTOCIN BOLUS FROM INFUSION
500.0000 mL | Freq: Once | INTRAVENOUS | Status: AC
  Administered 2019-10-25: 500 mL via INTRAVENOUS

## 2019-10-25 MED ORDER — MAGNESIUM SULFATE BOLUS VIA INFUSION
4.0000 g | Freq: Once | INTRAVENOUS | Status: AC
  Administered 2019-10-25: 4 g via INTRAVENOUS
  Filled 2019-10-25: qty 1000

## 2019-10-25 MED ORDER — SODIUM CHLORIDE (PF) 0.9 % IJ SOLN
INTRAMUSCULAR | Status: DC | PRN
  Administered 2019-10-25: 12 mL/h via EPIDURAL

## 2019-10-25 MED ORDER — EPHEDRINE 5 MG/ML INJ: 10.0000 mg | INTRAVENOUS | Status: DC | PRN

## 2019-10-25 MED ORDER — HYDRALAZINE HCL 20 MG/ML IJ SOLN: 10.0000 mg | INTRAMUSCULAR | Status: DC | PRN

## 2019-10-25 MED ORDER — WITCH HAZEL-GLYCERIN EX PADS: 1.0000 "application " | MEDICATED_PAD | CUTANEOUS | Status: DC | PRN

## 2019-10-25 MED ORDER — FENTANYL CITRATE (PF) 100 MCG/2ML IJ SOLN: 100.0000 ug | INTRAMUSCULAR | Status: DC | PRN

## 2019-10-25 MED ORDER — MAGNESIUM SULFATE 40 GM/1000ML IV SOLN
2.0000 g/h | INTRAVENOUS | Status: AC
  Administered 2019-10-26 (×2): 2 g/h via INTRAVENOUS
  Filled 2019-10-25: qty 1000

## 2019-10-25 MED ORDER — MISOPROSTOL 50MCG HALF TABLET
50.0000 ug | ORAL_TABLET | Freq: Once | ORAL | Status: DC
  Administered 2019-10-25: 50 ug via VAGINAL
  Filled 2019-10-25: qty 1

## 2019-10-25 MED ORDER — LIDOCAINE HCL (PF) 1 % IJ SOLN
INTRAMUSCULAR | Status: DC | PRN
  Administered 2019-10-25: 2 mL via EPIDURAL
  Administered 2019-10-25: 10 mL via EPIDURAL

## 2019-10-25 MED ORDER — PRENATAL MULTIVITAMIN CH
1.0000 | ORAL_TABLET | Freq: Every day | ORAL | Status: DC
  Administered 2019-10-26: 1 via ORAL
  Filled 2019-10-25: qty 1

## 2019-10-25 MED ORDER — ACETAMINOPHEN 325 MG PO TABS
650.0000 mg | ORAL_TABLET | ORAL | Status: DC | PRN
  Administered 2019-10-26: 650 mg via ORAL
  Filled 2019-10-25: qty 2

## 2019-10-25 MED ORDER — OXYCODONE-ACETAMINOPHEN 5-325 MG PO TABS: 2.0000 | ORAL_TABLET | ORAL | Status: DC | PRN

## 2019-10-25 MED ORDER — MISOPROSTOL 50MCG HALF TABLET: 50.0000 ug | ORAL_TABLET | Freq: Once | ORAL | Status: DC

## 2019-10-25 MED ORDER — ONDANSETRON HCL 4 MG/2ML IJ SOLN: 4.0000 mg | Freq: Four times a day (QID) | INTRAMUSCULAR | Status: DC | PRN

## 2019-10-25 MED ORDER — ONDANSETRON HCL 4 MG PO TABS: 4.0000 mg | ORAL_TABLET | ORAL | Status: DC | PRN

## 2019-10-25 MED ORDER — SENNOSIDES-DOCUSATE SODIUM 8.6-50 MG PO TABS
2.0000 | ORAL_TABLET | ORAL | Status: DC
  Administered 2019-10-26 (×2): 2 via ORAL
  Filled 2019-10-25 (×2): qty 2

## 2019-10-25 MED ORDER — OXYTOCIN 40 UNITS IN NORMAL SALINE INFUSION - SIMPLE MED
2.5000 [IU]/h | INTRAVENOUS | Status: DC
  Filled 2019-10-25: qty 1000

## 2019-10-25 MED ORDER — TETANUS-DIPHTH-ACELL PERTUSSIS 5-2.5-18.5 LF-MCG/0.5 IM SUSP: 0.5000 mL | Freq: Once | INTRAMUSCULAR | Status: DC

## 2019-10-25 MED ORDER — LABETALOL HCL 5 MG/ML IV SOLN
20.0000 mg | INTRAVENOUS | Status: DC | PRN
  Administered 2019-10-25: 22:00:00 20 mg via INTRAVENOUS
  Filled 2019-10-25 (×2): qty 4

## 2019-10-25 MED ORDER — COCONUT OIL OIL
1.0000 "application " | TOPICAL_OIL | Status: DC | PRN
  Administered 2019-10-26: 1 via TOPICAL

## 2019-10-25 MED ORDER — LABETALOL HCL 5 MG/ML IV SOLN
20.0000 mg | INTRAVENOUS | Status: DC | PRN
  Administered 2019-10-25: 20 mg via INTRAVENOUS

## 2019-10-25 MED ORDER — LIDOCAINE HCL (PF) 1 % IJ SOLN: 30.0000 mL | INTRAMUSCULAR | Status: DC | PRN

## 2019-10-25 MED ORDER — ACETAMINOPHEN 325 MG PO TABS: 650.0000 mg | ORAL_TABLET | ORAL | Status: DC | PRN

## 2019-10-25 MED ORDER — IBUPROFEN 600 MG PO TABS
600.0000 mg | ORAL_TABLET | Freq: Four times a day (QID) | ORAL | Status: DC
  Administered 2019-10-26 – 2019-10-27 (×6): 600 mg via ORAL
  Filled 2019-10-25 (×6): qty 1

## 2019-10-25 MED ORDER — BENZOCAINE-MENTHOL 20-0.5 % EX AERO
1.0000 "application " | INHALATION_SPRAY | CUTANEOUS | Status: DC | PRN
  Administered 2019-10-26: 1 via TOPICAL
  Filled 2019-10-25: qty 56

## 2019-10-25 MED ORDER — AMMONIA AROMATIC IN INHA
RESPIRATORY_TRACT | Status: AC
  Filled 2019-10-25: qty 10

## 2019-10-25 MED ORDER — LABETALOL HCL 5 MG/ML IV SOLN
40.0000 mg | INTRAVENOUS | Status: DC | PRN
  Filled 2019-10-25: qty 8

## 2019-10-25 MED ORDER — DIPHENHYDRAMINE HCL 25 MG PO CAPS: 25.0000 mg | ORAL_CAPSULE | Freq: Four times a day (QID) | ORAL | Status: DC | PRN

## 2019-10-25 MED ORDER — SOD CITRATE-CITRIC ACID 500-334 MG/5ML PO SOLN: 30.0000 mL | ORAL | Status: DC | PRN

## 2019-10-25 MED ORDER — AMLODIPINE BESYLATE 5 MG PO TABS
5.0000 mg | ORAL_TABLET | Freq: Every day | ORAL | Status: DC
  Administered 2019-10-26 – 2019-10-27 (×2): 5 mg via ORAL
  Filled 2019-10-25 (×2): qty 1

## 2019-10-25 MED ORDER — PENICILLIN G POT IN DEXTROSE 60000 UNIT/ML IV SOLN
3.0000 10*6.[IU] | INTRAVENOUS | Status: DC
  Administered 2019-10-25: 3 10*6.[IU] via INTRAVENOUS
  Filled 2019-10-25: qty 50

## 2019-10-25 MED ORDER — DIPHENHYDRAMINE HCL 50 MG/ML IJ SOLN: 12.5000 mg | INTRAMUSCULAR | Status: DC | PRN

## 2019-10-25 MED ORDER — FENTANYL-BUPIVACAINE-NACL 0.5-0.125-0.9 MG/250ML-% EP SOLN
12.0000 mL/h | EPIDURAL | Status: DC | PRN
  Filled 2019-10-25: qty 250

## 2019-10-25 MED ORDER — SIMETHICONE 80 MG PO CHEW
80.0000 mg | CHEWABLE_TABLET | ORAL | Status: DC | PRN
  Administered 2019-10-26: 80 mg via ORAL
  Filled 2019-10-25: qty 1

## 2019-10-25 MED ORDER — LABETALOL HCL 5 MG/ML IV SOLN
40.0000 mg | INTRAVENOUS | Status: DC | PRN
  Administered 2019-10-25: 13:00:00 40 mg via INTRAVENOUS

## 2019-10-25 MED ORDER — TERBUTALINE SULFATE 1 MG/ML IJ SOLN: 0.2500 mg | Freq: Once | INTRAMUSCULAR | Status: DC | PRN

## 2019-10-25 NOTE — Anesthesia Preprocedure Evaluation (Signed)
Anesthesia Evaluation  Patient identified by MRN, date of birth, ID band Patient awake    Reviewed: Allergy & Precautions, NPO status , Patient's Chart, lab work & pertinent test results  Airway Mallampati: II  TM Distance: >3 FB Neck ROM: Full    Dental no notable dental hx.    Pulmonary neg pulmonary ROS,    Pulmonary exam normal breath sounds clear to auscultation       Cardiovascular negative cardio ROS Normal cardiovascular exam Rhythm:Regular Rate:Normal     Neuro/Psych negative neurological ROS  negative psych ROS   GI/Hepatic negative GI ROS, Neg liver ROS,   Endo/Other  negative endocrine ROS  Renal/GU negative Renal ROS  negative genitourinary   Musculoskeletal negative musculoskeletal ROS (+)   Abdominal   Peds negative pediatric ROS (+)  Hematology negative hematology ROS (+) preE 155   Anesthesia Other Findings   Reproductive/Obstetrics (+) Pregnancy                             Anesthesia Physical Anesthesia Plan  ASA: II and emergent  Anesthesia Plan: Epidural   Post-op Pain Management:    Induction:   PONV Risk Score and Plan: 2  Airway Management Planned: Natural Airway  Additional Equipment: None  Intra-op Plan:   Post-operative Plan:   Informed Consent: I have reviewed the patients History and Physical, chart, labs and discussed the procedure including the risks, benefits and alternatives for the proposed anesthesia with the patient or authorized representative who has indicated his/her understanding and acceptance.       Plan Discussed with:   Anesthesia Plan Comments:         Anesthesia Quick Evaluation

## 2019-10-25 NOTE — H&P (Signed)
OBSTETRIC ADMISSION HISTORY AND PHYSICAL  April Taylor is a 34 y.o. female G66P2002 with IUP at [redacted]w[redacted]d by LMP presenting for vaginal bleeding/early labor and found to have severe Pre-E by BP's only. She reports +FMs, No LOF, no VB, no blurry vision, headaches or peripheral edema, and RUQ pain.  She plans on breast and bottle feeding. She requests Nexplanon for birth control. She received her prenatal care at [facility]   Dating: By LMP --->  Estimated Date of Delivery: 10/27/19  Sono: 8/10    @[redacted]w[redacted]d , CWD, normal anatomy with exception of mild bilateral renal pelvic fullness, cephalic presentation, anterior placental lie, 238g, 35% EFW   Prenatal History/Complications:  AMA GBSuria Hx of genital herpes; patient denies  Past Medical History: History reviewed. No pertinent past medical history.  Past Surgical History: Past Surgical History:  Procedure Laterality Date  . NO PAST SURGERIES      Obstetrical History: OB History    Gravida  3   Para  2   Term  2   Preterm  0   AB  0   Living  2     SAB  0   TAB  0   Ectopic  0   Multiple  0   Live Births  2           Social History: Social History   Socioeconomic History  . Marital status: Single    Spouse name: Not on file  . Number of children: Not on file  . Years of education: 20  . Highest education level: Not on file  Occupational History  . Not on file  Tobacco Use  . Smoking status: Never Smoker  . Smokeless tobacco: Never Used  Substance and Sexual Activity  . Alcohol use: No  . Drug use: No  . Sexual activity: Yes    Birth control/protection: None  Other Topics Concern  . Not on file  Social History Narrative   From [place], [place], [place].  In [place] since 2006.  Has 9th grade education   Social Determinants of Health   Financial Resource Strain:   . Difficulty of Paying Living Expenses: Not on file  Food Insecurity:   . Worried About Charity fundraiser in the Last Year: Not on  file  . Ran Out of Food in the Last Year: Not on file  Transportation Needs:   . Lack of Transportation (Medical): Not on file  . Lack of Transportation (Non-Medical): Not on file  Physical Activity:   . Days of Exercise per Week: Not on file  . Minutes of Exercise per Session: Not on file  Stress:   . Feeling of Stress : Not on file  Social Connections:   . Frequency of Communication with Friends and Family: Not on file  . Frequency of Social Gatherings with Friends and Family: Not on file  . Attends Religious Services: Not on file  . Active Member of Clubs or Organizations: Not on file  . Attends Archivist Meetings: Not on file  . Marital Status: Not on file    Family History: Family History  Problem Relation Age of Onset  . Osteoarthritis Mother   . Hypertension Son     Allergies: No Known Allergies  Medications Prior to Admission  Medication Sig Dispense Refill Last Dose  . acetaminophen (TYLENOL) 325 MG tablet Take 650 mg by mouth every 6 (six) hours as needed.   Past Week at Unknown time  . Prenatal Vit-Fe Fumarate-FA (MULTIVITAMIN-PRENATAL)  27-0.8 MG TABS tablet Take 1 tablet by mouth daily at 12 noon.   10/24/2019 at Unknown time  . triamcinolone ointment (KENALOG) 0.1 % Apply 1 application topically 2 (two) times daily. 30 g 2      Review of Systems   All systems reviewed and negative except as stated in HPI  Blood pressure (!) 157/98, pulse 90, temperature 97.6 F (36.4 C), temperature source Axillary, resp. rate 16, height 5\' 2"  (1.575 m), weight 71.2 kg, last menstrual period 01/20/2019, SpO2 99 %. General appearance: alert, cooperative and appears stated age Lungs: normal effort Heart: regular rate  Abdomen: soft, non-tender; bowel sounds normal Pelvic: gravid uterus GU: No vaginal lesions  Extremities: Homans sign is negative, no sign of DVT DTR's WNL Presentation: cephalic  By RN exam Fetal monitoringBaseline: 130 bpm, Variability: Good {> 6  bpm), Accelerations: Reactive and Decelerations: Absent Uterine activityFrequency: Every 5-6 minutes Dilation: 2.5 Effacement (%): 50 Station: Ballotable Exam by:: TLYTLE RN    Prenatal labs: ABO, Rh: --/--/A POS (01/05 1217) Antibody: NEG (01/05 1217) Rubella: 6.68 (06/19 1046) RPR: Non Reactive (10/15 1058)  HBsAg: Negative (06/19 1046)  HIV: Non Reactive (10/15 1058)  GBS:  uria 1 hr Glucola abnormal with normal 3 hour GTT Genetic screening  Not done Anatomy 08-31-2005 mild bilateral renal pelvic fullness otherwise grossly normal  Prenatal Transfer Tool  Maternal Diabetes: No Genetic Screening: Declined Maternal Ultrasounds/Referrals: mild bilateral renal pelvic fullness; no f/u US done Fetal Ultrasounds or other Referrals:  None Maternal Substance Abuse:  No Significant Maternal Medications:  None Significant Maternal Lab Results: Group B Strep positive  Results for orders placed or performed during the hospital encounter of 10/25/19 (from the past 24 hour(s))  Type and screen [facility]   Collection Time: 10/25/19 12:17 PM  Result Value Ref Range   ABO/RH(D) A POS    Antibody Screen NEG    Sample Expiration      10/28/2019,2359 Performed at [facility], [address] [place], [place]   CBC   Collection Time: 10/25/19 12:20 PM  Result Value Ref Range   WBC 8.5 4.0 - 10.5 K/uL   RBC 4.54 3.87 - 5.11 MIL/uL   Hemoglobin 13.7 12.0 - 15.0 g/dL   HCT 12/23/19 09.3 - 26.7 %   MCV 87.7 80.0 - 100.0 fL   MCH 30.2 26.0 - 34.0 pg   MCHC 34.4 30.0 - 36.0 g/dL   RDW 12.4 58.0 - 99.8 %   Platelets 155 150 - 400 K/uL   nRBC 0.0 0.0 - 0.2 %  Comprehensive metabolic panel   Collection Time: 10/25/19 12:20 PM  Result Value Ref Range   Sodium 135 135 - 145 mmol/L   Potassium 3.8 3.5 - 5.1 mmol/L   Chloride 106 98 - 111 mmol/L   CO2 19 (L) 22 - 32 mmol/L   Glucose, Bld 75 70 - 99 mg/dL   BUN 10 6 - 20 mg/dL   Creatinine, Ser 12/23/19 0.44 - 1.00  mg/dL   Calcium 9.4 8.9 - 2.50 mg/dL   Total Protein 6.7 6.5 - 8.1 g/dL   Albumin 2.9 (L) 3.5 - 5.0 g/dL   AST 17 15 - 41 U/L   ALT 9 0 - 44 U/L   Alkaline Phosphatase 98 38 - 126 U/L   Total Bilirubin 0.5 0.3 - 1.2 mg/dL   GFR calc non Af Amer >60 >60 mL/min   GFR calc Af Amer >60 >60 mL/min  Anion gap 10 5 - 15  Protein / creatinine ratio, urine   Collection Time: 10/25/19  1:28 PM  Result Value Ref Range   Creatinine, Urine 105.00 mg/dL   Total Protein, Urine 12 mg/dL   Protein Creatinine Ratio 0.11 0.00 - 0.15 mg/mg[Cre]    Patient Active Problem List   Diagnosis Date Noted  . Labor and delivery, indication for care 10/25/2019  . AMA (advanced maternal age) multigravida 35+ 10/25/2019  . Abnormal ultrasound 07/13/2019  . GBS bacteriuria 04/27/2019  . Herpes labialis 01/12/2013  . Psoriasis 06/09/2011  . Supervision of normal pregnancy 12/30/2010    Assessment/Plan:  April Taylor is a 34 y.o. G3P2002 at [redacted]w[redacted]d here for SOL and found to have severe Pre-E.  #Labor:Vertex by exam and BSUS. FB and Cytotec given as patient not feeling ctx. AROM, Pit PRN. Anticipate SVD.  #Pain: Per patient request #FWB: Cat I; EFW: 3100g #ID:  GBS pos; PCN ordered #MOF: both #MOC: Nexplanon #Circ:  Declines #Severe Pre-E: No hx of cHTN. Severe-range pressures in MAU. Patient given IV Labetalol and started on Magnesium. Currently asymptomatic. Serial BP's with Labetalol protocol as needed. Cont Mag 24 hours PP.  #Hx of HSV: only noted in chart, patient denies ever having vaginal lesions and exam without lesions on admission; no serum testing in chart and patient has not been taking Valtrex   Jerilynn Birkenhead, MD [facility] Family Medicine Fellow, [facility] for [facility], [facility] 10/25/2019, 3:26 PM

## 2019-10-25 NOTE — Discharge Summary (Signed)
Postpartum Discharge Summary    Patient Name: April Taylor DOB: May 02, 1986 MRN: [id number]  Date of admission: 10/25/2019 Delivering Provider: Chauncey Mann   Date of discharge: 10/27/2019  Admitting diagnosis: Labor and delivery, indication for care [O75.9] Intrauterine pregnancy: [redacted]w[redacted]d    Secondary diagnosis:  Active Problems:   Herpes labialis   GBS bacteriuria   Labor and delivery, indication for care   AMA (advanced maternal age) multigravida 35+   Severe preeclampsia   Retained placenta  Additional problems: None     Discharge diagnosis: Term Pregnancy Delivered and Preeclampsia (severe)                                                                                                Post partum procedures:Magnesium x 24 hours  Augmentation: Cytotec and Foley Balloon  Complications: None  Hospital course:  Induction of Labor With Vaginal Delivery   34y.o. yo G3P2002 at 3107w5das admitted to the hospital 10/25/2019 for induction of labor.  Indication for induction: Preeclampsia.  Patient had an uncomplicated labor course as follows: Patient presented to MAU for vaginal bleeding/early labor. Initial SVE 2/50/ballotable. Patient received Cytotec and Foley balloon. SROM occurred. Patient received epidural and progressed to complete with uncomplicated delivery. Membrane Rupture Time/Date: 5:00 PM ,10/25/2019   Intrapartum Procedures: Episiotomy: None [1]                                         Lacerations:  None [1]  Patient had delivery of a Viable infant.  Information for the patient's newborn:  PeDustin, Burrill0[[id number]]Delivery Method: Vag-Spont    10/25/2019  Details of delivery can be found in separate delivery note. Patient was continued on Magnesium for 24 hours. Norvasc 5 mg initiated for blood pressure with good control on Norvasc and increasing meds for d/c. Patient opted for [place]. Patient is discharged home 10/27/19. Delivery  time: 7:44 PM    Magnesium Sulfate received: Yes BMZ received: No Rhophylac:No MMR:No Transfusion:No  Physical exam  Vitals:   10/26/19 2242 10/27/19 0223 10/27/19 0609 10/27/19 0813  BP: 134/85 (!) 153/96 (!) 149/96 137/90  Pulse: (!) 105 88 88 89  Resp: 18 16 17 16   Temp: 98.5 F (36.9 C) 97.9 F (36.6 C) 98.2 F (36.8 C) 98.8 F (37.1 C)  TempSrc: Oral Oral Oral Oral  SpO2: 97% 97% 98% 97%  Weight:      Height:       General: alert, cooperative and no distress Lochia: appropriate Uterine Fundus: firm DVT Evaluation: No evidence of DVT seen on physical exam. Labs: Lab Results  Component Value Date   WBC 17.1 (H) 10/26/2019   HGB 12.7 10/26/2019   HCT 37.2 10/26/2019   MCV 88.6 10/26/2019   PLT 156 10/26/2019   CMP Latest Ref Rng & Units 10/26/2019  Glucose 70 - 99 mg/dL 93  BUN 6 - 20 mg/dL 6  Creatinine 0.44 - 1.00 mg/dL 0.56  Sodium 135 - 145  mmol/L 134(L)  Potassium 3.5 - 5.1 mmol/L 3.8  Chloride 98 - 111 mmol/L 107  CO2 22 - 32 mmol/L 20(L)  Calcium 8.9 - 10.3 mg/dL 7.0(L)  Total Protein 6.5 - 8.1 g/dL 5.5(L)  Total Bilirubin 0.3 - 1.2 mg/dL 0.6  Alkaline Phos 38 - 126 U/L 77  AST 15 - 41 U/L 23  ALT 0 - 44 U/L 12    Discharge instruction: per After Visit Summary and "Baby and Me Booklet".  After visit meds:  Allergies as of 10/27/2019   No Known Allergies     Medication List    TAKE these medications   acetaminophen 325 MG tablet Commonly known as: TYLENOL Take 650 mg by mouth every 6 (six) hours as needed.   amLODipine 10 MG tablet Commonly known as: NORVASC Take 1 tablet (10 mg total) by mouth daily.   multivitamin-prenatal 27-0.8 MG Tabs tablet Take 1 tablet by mouth daily at 12 noon.   triamcinolone ointment 0.1 % Commonly known as: KENALOG Apply 1 application topically 2 (two) times daily.       Diet: routine diet  Activity: Advance as tolerated. Pelvic rest for 6 weeks.   Outpatient follow up:4 weeks Follow up  Appt: Future Appointments  Date Time Provider [place]  10/28/2019 10:10 AM ACCESS TO CARE POOL [facility] [place]   Follow up Visit: [place] Follow up.   Contact information: Talihina Cornell           Please schedule this patient for Postpartum visit in: 4 weeks with the following provider: Any provider High risk pregnancy complicated by: HTN Delivery mode:  SVD Anticipated Birth Control:  Nexplanon PP Procedures needed: BP check  Schedule Integrated [place] visit: no   Newborn Data: Live born female  Birth Weight: 2951 gm 6 lb 8 oz APGAR (1 MIN): 8  APGAR (5 MINS): 9   Newborn Delivery   Birth date/time: 10/25/2019 19:44:00 Delivery type: Vaginal, Spontaneous      Baby Feeding: Bottle and Breast Disposition:home with mother   10/27/2019 Donnamae Jude, MD

## 2019-10-25 NOTE — Anesthesia Procedure Notes (Signed)
Epidural Patient location during procedure: OB Start time: 10/25/2019 6:18 PM End time: 10/25/2019 6:27 PM  Staffing Anesthesiologist: Lannie Fields, DO Performed: anesthesiologist   Preanesthetic Checklist Completed: patient identified, IV checked, risks and benefits discussed, monitors and equipment checked, pre-op evaluation and timeout performed  Epidural Patient position: sitting Prep: DuraPrep and site prepped and draped Patient monitoring: continuous pulse ox, blood pressure, heart rate and cardiac monitor Approach: midline Location: L3-L4 Injection technique: LOR air  Needle:  Needle type: Tuohy  Needle gauge: 17 G Needle length: 9 cm Needle insertion depth: 5 cm Catheter type: closed end flexible Catheter size: 19 Gauge Catheter at skin depth: 10 cm Test dose: negative  Assessment Sensory level: T8 Events: blood not aspirated, injection not painful, no injection resistance, no paresthesia and negative IV test  Additional Notes Patient identified. Risks/Benefits/Options discussed with patient including but not limited to bleeding, infection, nerve damage, paralysis, failed block, incomplete pain control, headache, blood pressure changes, nausea, vomiting, reactions to medication both or allergic, itching and postpartum back pain. Confirmed with bedside nurse the patient's most recent platelet count. Confirmed with patient that they are not currently taking any anticoagulation, have any bleeding history or any family history of bleeding disorders. Patient expressed understanding and wished to proceed. All questions were answered. Sterile technique was used throughout the entire procedure. Please see nursing notes for vital signs. Test dose was given through epidural catheter and negative prior to continuing to dose epidural or start infusion. Warning signs of high block given to the patient including shortness of breath, tingling/numbness in hands, complete motor block,  or any concerning symptoms with instructions to call for help. Patient was given instructions on fall risk and not to get out of bed. All questions and concerns addressed with instructions to call with any issues or inadequate analgesia.  Reason for block:procedure for pain

## 2019-10-25 NOTE — MAU Note (Signed)
Pt admits, was a little nervous, because of bleeding.  Denies HA, visual changes, epigastric pain, reports some increase in swelling.

## 2019-10-25 NOTE — MAU Note (Signed)
Sent from office, routine appt today.  Per note pt contracting and small amt of bloody show, +cervical change was 2+/50. Is having some contractions, they are not painful. They sent her because of bleeding, explained bloody show to pt.

## 2019-10-25 NOTE — MAU Provider Note (Signed)
Patient April Taylor is a 34 y.o. G3P2002 at [redacted]w[redacted]d here for labor rule out from [facility]. She denies HA, blurry vision, decreased fetal movements, floating spots, LOF or other ob-gyn complaints.     None      S: Ms. April Taylor is a 34 y.o. G3P2002 at [redacted]w[redacted]d  who presents to MAU today complaining contractions q 2-3 minutes since this morning She denies vaginal bleeding. She denies LOF. She reports normal fetal movement.    O: BP (!) 166/94   Pulse 80   Temp 98.4 F (36.9 C) (Oral)   Resp 16   LMP 01/20/2019 (Exact Date)   SpO2 99%  GENERAL: Well-developed, well-nourished female in no acute distress.  HEAD: Normocephalic, atraumatic.  CHEST: Normal effort of breathing, regular heart rate ABDOMEN: Soft, nontender, gravid  Cervical exam:  Dilation: 2.5 Effacement (%): 50 Cervical Position: Middle Station: Ballotable Presentation: Vertex Exam by:: TLYTLE RN    Fetal Monitoring: Baseline: 135 Variability: mod Accelerations: pres Decelerations: none Contractions: q2-3   A: SIUP at [redacted]w[redacted]d  Patient is in early labor, now with pre-e with severe features.   P: -Admit to L and D -Start magnesium sulfate infusion; start labetalol protocol.  -Pre-e labs collected -Continuous fetal monitoring.   Marylene Land, CNM 10/25/2019 1:04 PM       Charlesetta Garibaldi Nehal Witting 10/25/2019, 1:03 PM

## 2019-10-25 NOTE — Patient Instructions (Addendum)
Tercer trimestre de embarazo Third Trimester of Pregnancy El tercer trimestre comprende desde la semana28 hasta la semana40 (desde el mes7 hasta el mes9). El tercer trimestre es un perodo en el que el beb en gestacin (feto) crece rpidamente. Hacia el final del noveno mes, el feto mide alrededor de 20pulgadas (45cm) de largo y pesa entre 6 y 10 libras (2,700 y 4,500kg). Cambios en el cuerpo durante el tercer trimestre Su organismo continuar atravesando por muchos cambios durante el embarazo. Estos cambios varan de una mujer a otra. Durante el tercer trimestre:  Seguir aumentando de peso. Es de esperar que aumente entre 25 y 35libras (11 y 16kg) hacia el final del embarazo.  Podrn aparecer las primeras estras en las caderas, el abdomen y las mamas.  Puede tener necesidad de orinar con ms frecuencia porque el feto baja hacia la pelvis y ejerce presin sobre la vejiga.  Puede desarrollar o continuar teniendo acidez estomacal. Esto se debe a que el aumento de las hormonas hace que los msculos en el tubo digestivo trabajen ms lentamente.  Puede desarrollar o continuar teniendo estreimiento debido a que el aumento de las hormonas ralentiza la digestin y hace que los msculos que empujan los desechos a travs de los intestinos se relajen.  Puede desarrollar hemorroides. Estas son venas hinchadas (venas varicosas) en el recto que pueden causar picazn o dolor.  Puede desarrollar venas hinchadas y abultadas (venas varicosas) en las piernas.  Puede presentar ms dolor en la pelvis, la espalda o los muslos. Esto se debe al aumento de peso y al aumento de las hormonas que relajan las articulaciones.  Tal vez haya cambios en el cabello. Esto cambios pueden incluir su engrosamiento, crecimiento rpido y cambios en la textura. Adems, a algunas mujeres se les cae el cabello durante o despus del embarazo, o tienen el cabello seco o fino. Lo ms probable es que el cabello se le normalice  despus del nacimiento del beb.  Sus pechos seguirn creciendo y se pondrn cada vez ms sensibles. Un lquido amarillo (calostro) puede salir de sus pechos. Esta es la primera leche que usted produce para su beb.  El ombligo puede salir hacia afuera.  Puede observar que se le hinchan las manos, el rostro o los tobillos.  Puede presentar un aumento del hormigueo o entumecimiento en las manos, brazos y piernas. La piel de su vientre tambin puede sentirse entumecida.  Puede sentir que le falta el aire debido a que se expande el tero.  Puede tener ms problemas para dormir. Esto puede deberse al tamao de su vientre, una mayor necesidad de orinar y un aumento en el metabolismo de su cuerpo.  Puede notar que el feto "baja" o lo siente ms bajo, en el abdomen (aligeramiento).  Puede tener un aumento de la secrecin vaginal.  Puede notar que las articulaciones se sienten flojas y puede sentir dolor alrededor del hueso plvico. Qu debe esperar en las visitas prenatales Le harn exmenes prenatales cada 2semanas hasta la semana36. A partir de ese momento le harn los exmenes semanales. Durante una visita prenatal de rutina:  La pesarn para asegurarse de que usted y el beb estn creciendo normalmente.  Le tomarn la presin arterial.  Le medirn el abdomen para controlar el desarrollo del beb.  Se escucharn los latidos cardacos fetales.  Se evaluarn los resultados de los estudios solicitados en visitas anteriores.  Le revisarn el cuello del tero cuando est prxima la fecha de parto para controlar si el cuello uterino   se ha afinado o adelgazado (borrado).  Le harn una prueba de estreptococos del grupo B. Esto sucede entre las semanas 35 y 37. El mdico puede preguntarle lo siguiente:  Cmo le gustara que fuera el parto.  Cmo se siente.  Si siente los movimientos del beb.  Si ha tenido sntomas anormales, como prdida de lquido, sangrado, dolores de cabeza  intensos o clicos abdominales.  Si est consumiendo algn producto que contenga tabaco, como cigarrillos, tabaco de mascar y cigarrillos electrnicos.  Si tiene alguna pregunta. Otros exmenes o estudios de deteccin que pueden realizarse durante el tercer trimestre incluyen lo siguiente:  Anlisis de sangre para controlar los niveles de hierro (anemia).  Controles fetales para determinar su salud, nivel de actividad y crecimiento. Si tiene alguna enfermedad o hay problemas durante el embarazo, le harn estudios.  Prueba sin estrs. Esta prueba verifica la salud de su beb y se utiliza para detectar signos de problemas, tales como si el beb no est recibiendo suficiente oxgeno. Durante esta prueba, se coloca un cinturn alrededor de su vientre. Al moverse el beb, se controla su frecuencia cardaca. Qu es el falso trabajo de parto? El falso trabajo de parto es una afeccin en la que se sienten pequeos e irregulares espasmos de los msculos del tero (contracciones) que generalmente desaparecen al hacer reposo, cambiar de posicin o al beber agua. Estas contracciones se llaman contracciones de Braxton Hicks. Las contracciones pueden durar horas, das o incluso semanas, antes de que el verdadero trabajo de parto se inicie. Si las contracciones ocurren a intervalos regulares, se vuelven ms frecuentes, aumentan en intensidad o se vuelven dolorosas, debera ver al mdico.  Cules son los signos del trabajo de parto?  Clicos abdominales.  Contracciones regulares que comienzan en intervalos de 10 minutos y se vuelven ms fuertes y ms frecuentes con el tiempo.  Contracciones que comienzan en la parte superior del tero y se extienden hacia abajo, a la zona inferior del abdomen y la espalda.  Aumento de la presin en la pelvis y dolor latente en la espalda.  Una secrecin de mucosidad acuosa o con sangre que sale de la vagina.  Prdida de lquido amnitico. Esto tambin se conoce como  "ruptura de la bolsa de las aguas". Esto puede ser un chorro o un goteo constante y lento de lquido. Informe a su mdico si tiene un color u olor extrao. Si tiene alguno de estos signos, llame a su mdico de inmediato, incluso si es antes de la fecha de parto. Siga estas indicaciones en su casa: Medicamentos  Siga las indicaciones del mdico en relacin con el uso de medicamentos. Durante el embarazo, hay medicamentos que pueden tomarse y otros que no.  Tome vitaminas prenatales que contengan por lo menos 600microgramos (?g) de cido flico.  Si est estreida, tome un laxante suave, si el mdico lo autoriza. Qu debe comer y beber   Lleve una dieta equilibrada que incluya gran cantidad de frutas y verduras frescas, cereales integrales, buenas fuentes de protenas como carnes magras, huevos o tofu, y lcteos descremados. El mdico la ayudar a determinar la cantidad de peso que puede aumentar.  No coma carne cruda ni quesos sin cocinar. Estos elementos contienen grmenes que pueden causar defectos congnitos en el beb.  Si no consume muchos alimentos con calcio, hable con su mdico sobre si debera tomar un suplemento diario de calcio.  La ingesta diaria de cuatro o cinco comidas pequeas en lugar de tres comidas abundantes.  Limite el   consumo de alimentos con alto contenido de grasas y azcares procesados, como alimentos fritos o dulces.  Para evitar el estreimiento: ? Bebe suficiente lquido para mantener la orina clara o de color amarillo plido. ? Consuma alimentos ricos en fibra, como frutas y verduras frescas, cereales integrales y frijoles. Actividad  Haga ejercicio solamente como se lo haya indicado el mdico. La mayora de las mujeres pueden continuar su rutina de ejercicios durante el embarazo. Intente realizar como mnimo 30minutos de actividad fsica por lo menos 5das a la semana. Deje de hacer ejercicio si experimenta contracciones uterinas.  Evite levantar pesos  excesivos.  No haga ejercicio en condiciones de calor o humedad extremas, o a grandes alturas.  Use zapatos cmodos de tacn bajo.  Adopte una buena postura.  Puede seguir teniendo relaciones sexuales, excepto que el mdico le diga lo contrario. Alivio del dolor y del malestar  Haga pausas frecuentes y descanse con las piernas elevadas si tiene calambres en las piernas o dolor en la zona lumbar.  Dese baos de asiento con agua tibia para aliviar el dolor o las molestias causadas por las hemorroides. Use una crema para las hemorroides si el mdico la autoriza.  Use un sostn que le brinde buen soporte para prevenir las molestias causadas por la sensibilidad en los pechos.  Si tiene venas varicosas: ? Use pantimedias que brinden soporte o medias de compresin como se lo haya indicado el mdico. ? Eleve los pies durante 15minutos, 3 o 4veces por da. Cuidados prenatales  Escriba sus preguntas. Llvelas cuando concurra a las visitas prenatales.  Concurra a todas las visitas prenatales tal como se lo haya indicado el mdico. Esto es importante. Seguridad  Use el cinturn de seguridad en todo momento mientras conduce.  Haga una lista de los nmeros de telfono de emergencia, que incluya los nmeros de telfono de familiares, amigos, el hospital y los departamentos de polica y bomberos. Instrucciones generales  Evite el contacto con las bandejas sanitarias de los gatos y la tierra que estos animales usan. Estos elementos contienen grmenes que pueden causar defectos congnitos en el beb. Si tiene un gato, pdale a alguien que limpie la caja de arena por usted.  No haga viajes largos excepto que sea absolutamente necesario y solo con la autorizacin de su mdico.  No se d baos de inmersin en agua caliente, baos turcos ni saunas.  No beber alcohol.  No consuma ningn producto que contenga nicotina o tabaco, como cigarrillos y cigarrillos electrnicos. Si necesita ayuda para  dejar de fumar, consulte al mdico.  No use hierbas medicinales ni medicamentos que no le hayan recetado. Estas sustancias qumicas afectan la formacin y el desarrollo del beb.  No se haga duchas vaginales ni use tampones o toallas higinicas perfumadas.  No mantenga las piernas cruzadas durante largos periodos de tiempo.  Para prepararse para la llegada de su beb: ? Tome clases prenatales para entender, practicar, y hacer preguntas sobre el trabajo de parto y el parto. ? Haga un ensayo de la partida al hospital. ? Visite el hospital y recorra el rea de maternidad. ? Pida un permiso de maternidad o paternidad a sus empleadores. ? Organice para que algn familiar o amigo cuide a sus mascotas mientras usted est en el hospital. ? Compre un asiento de seguridad orientado hacia atrs, y asegrese de saber cmo instalarlo en su automvil. ? Prepare el bolso que llevar al hospital. ? Prepare la habitacin del beb. Asegrese de quitar todas las almohadas y animales   de peluche de la cuna del beb para evitar la asfixia.  Visite a su dentista si no lo ha Occupational hygienist. Use un cepillo de dientes blando para higienizarse los dientes y psese el hilo dental con suavidad. Comunquese con un mdico si:  No est segura de que est en trabajo de parto o de que ha roto la bolsa de las aguas.  Se siente mareada.  Siente clicos leves, presin en la pelvis o dolor persistente en el abdomen.  Siente dolor en la parte inferior de la espalda.  Tiene nuseas, vmitos o diarrea persistentes.  Brett Fairy secrecin vaginal inusual o con mal olor.  Siente dolor al [employer]. Solicite ayuda de inmediato si:  Rompe la bolsa de las aguas antes de la semana 37.  Tiene contracciones regulares en intervalos de menos de 5 minutos antes de la semana 37.  Tiene fiebre.  Tiene una prdida de lquido por la vagina.  Tiene sangrado o pequeas prdidas vaginales.  Tiene dolor o clicos  abdominales intensos.  Baja de peso o sube de peso rpidamente.  Tiene dificultad para respirar y siente dolor de pecho.  Sbitamente se le hinchan mucho el rostro, las [place], los tobillos, los pies o las piernas.  Su beb se mueve menos de 10 veces en 2 horas.  Siente un dolor de cabeza intenso que no se alivia al tomar [employer].  Nota cambios en la visin. Resumen  El tercer trimestre comprende desde la semana28 [employer] semana40, es decir, desde el mes7 hasta el [address] El tercer trimestre es un perodo en el que el beb en gestacin (feto) crece rpidamente.  Durante el tercer trimestre, su incomodidad puede aumentar a medida que usted y su beb continan aumentando de [place]. Es posible que tenga dolor abdominal, en las piernas y en la [place], [place] para dormir y [place] mayor necesidad de Geographical information systems officer.  Durante el tercer trimestre, sus pechos seguirn creciendo y se pondrn cada vez ms sensibles. Un lquido amarillo Charity fundraiser) puede salir de sus pechos. Esta es la primera leche que usted produce para su beb.  El falso trabajo de parto es una afeccin en la que se sienten pequeos e irregulares espasmos de los msculos del tero (contracciones) que a Corporate investment banker. Estas contracciones se llaman contracciones de [employer]. Las [employer] pueden durar horas, [address]  [address] o incluso semanas, antes de que el verdadero trabajo de parto se inicie.  Los signos del trabajo de parto pueden incluir: calambres abdominales; contracciones regulares que comienzan en intervalos de 10 minutos y se vuelven ms fuertes y ms frecuentes con el tiempo; una secrecin de mucosidad acuosa o con sangre que sale de la vagina; aumento de la presin en la pelvis y Engineer, mining latente en la espalda; y prdida de lquido amnitico. Esta informacin no tiene Theme park manager el consejo del mdico. Asegrese de hacerle al mdico cualquier pregunta que tenga. Document Revised: 02/17/2017 Document Reviewed:  02/17/2017 Elsevier Patient Education  2020 [employer].  Si tiene alguna prdida de lquido, disminucin del movimiento fetal, secrecin, sangrado vaginal o dolor significativo, vaya al MAU  Si empieza a tener contracciones fuertes llmenos inmediatamente o acuda al MAU   Vaya a la MAU para que le revisen el cuello uterino, ya que con sangrado puede estar en [place]

## 2019-10-25 NOTE — Progress Notes (Signed)
Labor Progress Note [name] is a 34 y.o. G3P2002 at [redacted]w[redacted]d presented for vaginal bleeding and found to have severe Pre-E. S: Feeling more pressure and urge to push.  O:  BP (!) 152/101   Pulse 88   Temp 97.6 F (36.4 C) (Axillary)   Resp 16   Ht 5\' 2"  (1.575 m)   Wt 71.2 kg   LMP 01/20/2019 (Exact Date)   SpO2 99%   BMI 28.71 kg/m  EFM: 120, moderate variability, pos accels, early decels Ctx: q53m  CVE: Dilation: 7 Effacement (%): 80 Cervical Position: Middle Station: -2 Presentation: Vertex Exam by:: Dr. [email or web address]   A&P: 34 y.o. 31 [redacted]w[redacted]d here for IOL for severe Pre-E. #Labor: Progressing well. S/p Cytotec and foley bulb. SROM occurred and patient now 7 cm. Anticipate SVD soon. #Pain: desires epidural; requested #FWB: Cat I #GBS positive #Severe Pre-E: No hx of cHTN. Severe-range pressures in MAU. Patient given IV Labetalol and started on Magnesium. Currently asymptomatic. Serial BP's with Labetalol protocol as needed. Cont Mag 24 hours PP.   [redacted]w[redacted]d, MD 6:12 PM

## 2019-10-26 ENCOUNTER — Encounter (HOSPITAL_COMMUNITY): Payer: Self-pay | Admitting: Family Medicine

## 2019-10-26 LAB — COMPREHENSIVE METABOLIC PANEL
ALT: 12 U/L (ref 0–44)
AST: 23 U/L (ref 15–41)
Albumin: 2.3 g/dL — ABNORMAL LOW (ref 3.5–5.0)
Alkaline Phosphatase: 77 U/L (ref 38–126)
Anion gap: 7 (ref 5–15)
BUN: 6 mg/dL (ref 6–20)
CO2: 20 mmol/L — ABNORMAL LOW (ref 22–32)
Calcium: 7 mg/dL — ABNORMAL LOW (ref 8.9–10.3)
Chloride: 107 mmol/L (ref 98–111)
Creatinine, Ser: 0.56 mg/dL (ref 0.44–1.00)
GFR calc Af Amer: >60 mL/min (ref >60)
GFR calc non Af Amer: >60 mL/min (ref >60)
Glucose, Bld: 93 mg/dL (ref 70–99)
Potassium: 3.8 mmol/L (ref 3.5–5.1)
Sodium: 134 mmol/L — ABNORMAL LOW (ref 135–145)
Total Bilirubin: 0.6 mg/dL (ref 0.3–1.2)
Total Protein: 5.5 g/dL — ABNORMAL LOW (ref 6.5–8.1)

## 2019-10-26 LAB — CBC
HCT: 37.2 % (ref 36.0–46.0)
Hemoglobin: 12.7 g/dL (ref 12.0–15.0)
MCH: 30.2 pg (ref 26.0–34.0)
MCHC: 34.1 g/dL (ref 30.0–36.0)
MCV: 88.6 fL (ref 80.0–100.0)
Platelets: 156 10*3/uL (ref 150–400)
RBC: 4.2 MIL/uL (ref 3.87–5.11)
RDW: 13.2 % (ref 11.5–15.5)
WBC: 17.1 10*3/uL — ABNORMAL HIGH (ref 4.0–10.5)
nRBC: 0 % (ref 0.0–0.2)

## 2019-10-26 MED ORDER — LACTATED RINGERS IV SOLN: INTRAVENOUS | Status: DC

## 2019-10-26 NOTE — Lactation Note (Signed)
This note was copied from a baby's chart. Lactation Consultation Note  Patient Name: April Taylor [id number] Date: 10/26/2019 Reason for consult: Initial assessment;Term  Visited with mom of an 17 hours old, she's doing both, breast and formula, baby is currently on [place]. Mom is a P3 and experienced BF. She BF her first baby for 9 months and her second baby for 15 months. She participated in the [facility] program at the [facility] and she''s already familiar with hand expression and able to get colostrum when doing so. When [facility] assisted with hand expression she was able to get big drops very easily, praised her for her efforts.   Baby asleep when entered the room, offered assistance with latch but mom didn't want to wake him up. Asked mom to call for assistance when needed. Reviewed normal newborn behavior, feeding cues, STS care, lactogenesis II and pumping schedule. Her RN has already set her up with a DEBP but mom hasn't used it, she's not sure if she'll use it at all, she told LC she's never used one before and didn't feel comfortable doing it.   Showed mom how to convert her DEBP kit into a hand pump for home use and also reviewed DEBP instructions and offered to assist with pumping since baby was asleep, but mom declined. She said she's just going to put baby to breast, still unsure of pumping, respected her wishes.   Feeding plan:  1. Encouraged mom to feed baby STS 8-12 times/24 hours or sooner if feeding cues are present 2. Encouraged mom to pump every time baby is given formula in order to keep up with breast stimulation and protect her supply  BF brochure (SP), BF resources (SP) and feeding diary (SP) were reviewed. Mom and baby's aunt reported all questions and concerns were answered, they're both aware of [facility] services and will call PRN.   Maternal Data Formula Feeding for Exclusion: Yes Reason for exclusion: Mother's choice to formula and breast feed on admission Has  patient been taught Hand Expression?: Yes Does the patient have breastfeeding experience prior to this delivery?: Yes  Feeding Feeding Type: Breast Fed  LATCH Score                   Interventions Interventions: Breast feeding basics reviewed;Breast massage;Hand express;Breast compression;DEBP  Lactation Tools Discussed/Used Tools: Coconut oil WIC Program: Yes Pump Review: Setup, frequency, and cleaning Initiated by:: RN Date initiated:: 10/26/19   Consult Status Consult Status: Follow-up Date: 10/27/19 Follow-up type: In-patient    April Taylor 10/26/2019, 2:27 PM

## 2019-10-26 NOTE — Lactation Note (Signed)
This note was copied from a baby's chart. Lactation Consultation Note  Patient Name: April Taylor [id number] Date: 10/26/2019   Attempted to visit with mom but she was having lunch and asked LC to come back later. LC to do initial assessment on second visit. Noticed that sister-in-law was also in the room as a support person and she had baby wrapped in a full/queen size blanket from home, baby could barely be seen. Explained to parents that baby needs to be put in the bassinet whenever he's asleep, and educated them on safe sleep practices, they voiced understanding. LC swaddled baby with a baby blanket and placed him on his bassinet.  Maternal Data    Feeding    LATCH Score                   Interventions    Lactation Tools Discussed/Used     Consult Status      Pami Wool Venetia Constable 10/26/2019, 1:17 PM

## 2019-10-26 NOTE — Anesthesia Postprocedure Evaluation (Signed)
Anesthesia Post Note  Patient: April Taylor  Procedure(s) Performed: AN AD HOC LABOR EPIDURAL     Patient location during evaluation: Mother Baby Anesthesia Type: Epidural Level of consciousness: awake and alert Pain management: pain level controlled Vital Signs Assessment: post-procedure vital signs reviewed and stable Respiratory status: spontaneous breathing, nonlabored ventilation and respiratory function stable Cardiovascular status: stable Postop Assessment: no headache, no backache, epidural receding, patient able to bend at knees, no apparent nausea or vomiting, adequate PO intake and able to ambulate Anesthetic complications: no    Last Vitals:  Vitals:   10/25/19 2209 10/26/19 0441  BP: (!) 156/99 138/87  Pulse: 95 92  Resp: 18 18  Temp: 36.6 C 36.8 C  SpO2:  95%    Last Pain:  Vitals:   10/26/19 0441  TempSrc: Oral  PainSc:    Pain Goal:                   Laban Emperor

## 2019-10-26 NOTE — Progress Notes (Signed)
Post Partum Day 1 Spanish interpreter: Live person used  Subjective: no complaints Bleeding is well controlled.  Objective: Blood pressure (!) 137/93, pulse 96, temperature 98.4 F (36.9 C), temperature source Oral, resp. rate 18, height 5\' 2"  (1.575 m), weight 71.2 kg, last menstrual period 01/20/2019, SpO2 97 %, unknown if currently breastfeeding.  Physical Exam:  General: alert, cooperative and appears stated age Lochia: appropriate Uterine Fundus: firm DVT Evaluation: No evidence of DVT seen on physical exam.  Recent Labs    10/25/19 2049 10/26/19 0710  HGB 14.4 12.7  HCT 42.5 37.2    Assessment/Plan: Breastfeeding and Contraception Nexplanon  Magnesium x 24 hours postpartum BP is well controlled since delivery, on Norvasc. Meds to beds consult done.   LOS: 1 day   12/24/19 10/26/2019, 11:18 AM

## 2019-10-27 MED ORDER — AMLODIPINE BESYLATE 10 MG PO TABS: 10.0000 mg | ORAL_TABLET | Freq: Every day | ORAL | 1 refills | Status: DC

## 2019-10-27 MED FILL — AMLODIPINE BESYLATE 10 MG T: 10 | 30 days supply | Qty: 30 | Fill #0

## 2019-10-27 NOTE — Progress Notes (Signed)
Discharge instructions reviewed with patient. Interpreter present. Patient verbalized understanding of hypertension education, prescriptions, and follow up care.

## 2019-10-27 NOTE — Discharge Instructions (Signed)
Hipertensin posparto Postpartum Hypertension La hipertensin posparto es el aumento de la presin arterial que se mantiene ms alta de lo normal despus del parto. Puede no darse cuenta de que tiene hipertensin posparto si no le miden la presin arterial con regularidad. En la [employer], la hipertensin posparto desaparece sola, por lo general en la semana posterior al parto. Sin embargo, algunas mujeres requieren tratamiento mdico para prevenir complicaciones graves, como convulsiones o un accidente cerebrovascular. Cules son las causas? Esta afeccin puede ser causada por uno o ms de lo siguiente:  Hipertensin que exista antes del embarazo (hipertensin crnica).  Hipertensin que surge como resultado del embarazo (hipertensin gestacional).  Trastornos hipertensivos Academic librarian (preeclampsia) o convulsiones en las mujeres que tienen hipertensin arterial durante el embarazo (eclampsia).  Una afeccin en la que el hgado, las plaquetas y los glbulos rojos se daan durante el embarazo (sndrome de HELLP).  Una afeccin en la cual la glndula tiroidea produce demasiadas hormonas (hipertiroidismo).  Otros problemas poco frecuentes de los nervios (trastornos neurolgicos) o trastornos de Risk manager. En algunos casos, es posible que la causa se desconozca. Qu incrementa el riesgo? Los siguientes factores pueden hacer que usted sea ms propensa a tener esta afeccin:  Hipertensin crnica. En algunos casos, es posible que esta no se haya diagnosticado antes del embarazo.  Obesidad.  Diabetes tipo 2.  Enfermedad renal.  Antecedentes de preeclampsia o eclampsia.  Otras afecciones mdicas que modifican el nivel de hormonas en el cuerpo (desequilibrio hormonal). Cules son los signos o los sntomas? Al igual que con todos los tipos de hipertensin, la hipertensin posparto puede no causar ningn sntoma. Segn lo alta que est la presin arterial, puede  presentar lo siguiente:  Dolores de [place]. Estos pueden ser leves, moderados o intensos. Tambin pueden ser regulares, constantes o de inicio repentino (cefalea en estallido).  Cambios en la capacidad para ver (cambios visuales).  Mareos.  Falta de aire.  Hinchazn de [employer], los pies, la parte inferior de las piernas o el rostro. En algunos casos, puede tener hinchazn en varias de estas reas.  Palpitaciones o latidos cardacos acelerados.  Dificultad para respirar al Tressie Ellis.  Disminucin en la cantidad de orina que elimina. Otros signos y sntomas poco frecuentes pueden incluir lo siguiente:  Ms sudoracin que la habitual. Esta dura algo ms que [employer] del parto.  Dolor en el pecho.  Mareos repentinos al levantarse despus de haber estado sentada o [place].  Convulsiones.  Nuseas o vmitos.  Dolor abdominal. Cmo se diagnostica? Esta afeccin se puede diagnosticar en funcin de los resultados de un examen fsico, mediciones de la presin arteria, y [place] de [place] y [place]. Tambin puede someterse a otras pruebas, como una exploracin por tomografa computarizada (TC) o una resonancia magntica (RM) para Engineer, manufacturing otros problemas de la hipertensin posparto. Cmo se trata? Si la presin arterial est lo suficientemente alta como para requerir tratamiento, las opciones pueden incluir lo siguiente:  Medicamentos para disminuir la presin arterial (antihipertensivos). Dgale al mdico si est amamantando o si planifica hacerlo. Hay muchos medicamentos antihipertensivos que pueden tomarse sin riesgos durante la Market researcher.  Interrupcin de los [employer] puedan causar la hipertensin.  Tratamiento de las enfermedades que causan la hipertensin.  Tratamiento de las complicaciones de la hipertensin, como convulsiones, accidente cerebrovascular o problemas renales. El mdico seguir controlando atentamente la presin arterial hasta que esta se  encuentre en un nivel seguro para usted. Siga estas indicaciones en su  casa:  Tome los medicamentos de venta libre y los recetados solamente como se lo haya indicado el mdico.  Retome sus actividades normales como se lo haya indicado el mdico. Pregntele al mdico qu actividades son seguras para usted.  No consuma ningn producto que contenga nicotina o tabaco, como cigarrillos y Administrator, Civil Service. Si necesita ayuda para dejar de fumar, consulte al mdico.  Concurra a todas las visitas de control como se lo haya indicado el mdico. Esto es importante. Comunquese con un mdico si:  Los sntomas empeoran.  Aparecen nuevos sntomas, por ejemplo: ? Un dolor de cabeza que no mejora. ? Mareos. ? Cambios en la visin. Solicite ayuda de inmediato si:  Presenta hinchazn repentina [employer], los tobillos o el rostro.  Tiene un aumento de peso repentino y rpido.  Tiene dificultad para respirar, dolor en el pecho, latidos cardacos acelerados o palpitaciones cardacas.  Siente dolor intenso en el abdomen.  Tiene sntomas de un accidente cerebrovascular. "BE FAST" es una manera fcil de recordar las principales seales de advertencia de un accidente cerebrovascular: ? B - Balance (equilibrio). Los signos son dificultad repentina para caminar o prdida del equilibrio. ? E - Eyes (ojos). Los signos son dificultad para ver o un cambio repentino en la visin. ? F - Face (rostro). Los signos son debilidad repentina o entumecimiento del rostro, o el rostro o el prpado que se caen hacia un lado. ? A - Arms (brazos). Los signos son debilidad o entumecimiento en un brazo. Esto sucede de repente y generalmente en un lado del cuerpo. ? S - Speech (habla). Los signos son dificultad para hablar, hablar arrastrando las palabras o dificultad para comprender lo que la gente dice. ? T - Time (tiempo). Es tiempo de Freight forwarder a los servicios de Sports administrator. Escriba la hora en la que comenzaron los  sntomas.  Presenta otros signos de accidente cerebrovascular, como los siguientes: ? Dolor de cabeza sbito e intenso que no tiene causa aparente. ? Nuseas o vmitos. ? Convulsiones. Estos sntomas pueden representar un problema grave que constituye Radio broadcast assistant. No espere hasta que los sntomas desaparezcan. Solicite atencin mdica de inmediato. Comunquese con el servicio de emergencias de su localidad (911 en los [place]). No conduzca por sus propios medios [employer] hospital. Resumen  La hipertensin posparto es el aumento de la presin arterial que se mantiene ms alta de lo normal despus del [place].  En la [employer], la hipertensin posparto desaparece sola, por lo general en la semana posterior al parto.  Algunas mujeres requieren tratamiento mdico para prevenir complicaciones graves, como convulsiones o un accidente cerebrovascular. Esta informacin no tiene Theme park manager el consejo del mdico. Asegrese de hacerle al mdico cualquier pregunta que tenga. Document Revised: 01/16/2018 Document Reviewed: 09/27/2017 Elsevier Patient Education  2020 [employer].

## 2019-10-28 ENCOUNTER — Encounter: Payer: Self-pay | Admitting: Family Medicine

## 2019-10-28 ENCOUNTER — Ambulatory Visit: Payer: Self-pay

## 2019-10-28 LAB — SURGICAL PATHOLOGY

## 2019-10-28 NOTE — Lactation Note (Signed)
This note was copied from a baby's chart. Lactation Consultation Note  Patient Name: April Taylor [id number] Date: 10/28/2019 Reason for consult: Follow-up assessment Baby is 52 hours old and 3% weight loss.  Video interpreter used for visit.  Mom is both breast and formula feeding by choice.  She states breastfeeding is going well.  Mom states she plans on continuing formula after milk comes to volume.  Discussed milk coming to volume and the prevention and treatment of engorgement.  Stressed importance of putting baby to breast prior to formula to establish a good milk supply,  Mom denies questions or concerns.  Encouraged to call for assist prn.  Maternal Data    Feeding Feeding Type: Bottle Fed - Formula Nipple Type: Slow - flow  LATCH Score                   Interventions    Lactation Tools Discussed/Used     Consult Status Consult Status: Complete Follow-up type: Call as needed    Huston Foley 10/28/2019, 8:47 AM

## 2019-10-31 NOTE — Addendum Note (Signed)
Addended by: Shilo Philipson L on: 10/31/2019 04:06 PM   Modules accepted: Level of Service  

## 2020-01-18 ENCOUNTER — Other Ambulatory Visit: Payer: Self-pay

## 2020-01-18 ENCOUNTER — Ambulatory Visit (INDEPENDENT_AMBULATORY_CARE_PROVIDER_SITE_OTHER): Payer: Self-pay | Admitting: Family Medicine

## 2020-01-18 MED ORDER — CLOBETASOL PROPIONATE 0.05 % EX SOLN: 1.0000 "application " | Freq: Two times a day (BID) | CUTANEOUS | 0 refills | Status: DC

## 2020-01-18 NOTE — Progress Notes (Signed)
Subjective:    April Taylor is a 34 y.o. G1P3003 female who presents for a postpartum visit. She is 3 months postpartum following a spontaneous vaginal delivery. I have fully reviewed the prenatal and intrapartum course. The delivery was at [redacted]w[redacted]d gestational weeks. Outcome: spontaneous vaginal delivery, complicated by pre-e. Anesthesia: epidural. Postpartum course has been unremarkable. Baby's course has been good. Baby is feeding by breast and bottle. Bleeding no bleeding. Bowel function is normal. Bladder function is normal. Patient is not sexually active. Contraception method is none.  She would like to have the Nexplanon, does not have insurance.  Postpartum depression screening: negative.  The following portions of the patient's history were reviewed and updated as appropriate: allergies, current medications, past medical history, past surgical history and problem list.  Review of Systems Pertinent items are noted in HPI.   Vitals:   01/18/20 0948  BP: 110/80  Pulse: 94  Weight: 140 lb 4 oz (63.6 kg)    Objective:   General: Alert, NAD HEENT: NCAT, MMM Cardiac: RRR no m/g/r Lungs: Clear bilaterally, no increased WOB  Abdomen: soft, non-tender, non-distended, normoactive BS Msk: Moves all extremities spontaneously  Ext: Warm, dry, 2+ distal pulses  Assessment:   2.5 months postpartum exam, s/p SVD, doing well  Depression screening negative  Contraception counseling   Plan:  Birth control: Desires Nexplanon, none currently.  Not sexually active at this time.  Recommended proceeding with HD evaluation for placement due to cost, could look into nexplanon scholarship within our clinic if still too expensive at HD.   Refilled clobetasol solution for known scalp psorasis, provided with prescription in hand.   Follow up as needed.   Allayne Stack, DO

## 2020-01-18 NOTE — Patient Instructions (Addendum)
Wonderful to see you, so glad you are doing well! I will look into alternatives for getting the nexplanon placed for you.

## 2020-01-19 ENCOUNTER — Encounter: Payer: Self-pay | Admitting: Family Medicine

## 2020-01-30 ENCOUNTER — Encounter: Payer: Self-pay | Admitting: Family Medicine

## 2020-01-30 ENCOUNTER — Other Ambulatory Visit: Payer: Self-pay

## 2020-01-30 ENCOUNTER — Ambulatory Visit (INDEPENDENT_AMBULATORY_CARE_PROVIDER_SITE_OTHER): Payer: Self-pay | Admitting: Family Medicine

## 2020-01-30 VITALS — BP 120/80 | HR 98 | Ht 61.81 in | Wt 140.0 lb

## 2020-01-30 DIAGNOSIS — Z3009 Encounter for other general counseling and advice on contraception: Secondary | ICD-10-CM

## 2020-01-30 DIAGNOSIS — Z30011 Encounter for initial prescription of contraceptive pills: Secondary | ICD-10-CM

## 2020-01-30 DIAGNOSIS — L409 Psoriasis, unspecified: Secondary | ICD-10-CM

## 2020-01-30 LAB — POCT URINE PREGNANCY: Preg Test, Ur: NEGATIVE

## 2020-01-30 MED ORDER — NORETHINDRONE 0.35 MG PO TABS: 1.0000 | ORAL_TABLET | Freq: Every day | ORAL | 11 refills | Status: DC

## 2020-01-30 MED ORDER — CLOBETASOL PROPIONATE 0.05 % EX SOLN: 1.0000 "application " | Freq: Two times a day (BID) | CUTANEOUS | 3 refills | Status: DC

## 2020-01-30 NOTE — Patient Instructions (Addendum)
Wonderful to see you today.  Please fill out that form in its entirety and bring 1 of those extra documents back to our office and then we will fax this out for approval.  In the meantime I have sent in birth control pills for you to start.  Please use a backup method for the next 2 weeks.

## 2020-01-30 NOTE — Progress Notes (Signed)
    SUBJECTIVE:   CHIEF COMPLAINT / HPI: Birth control counseling  Ms. April Taylor is a 34 year old G3P3 presenting to further discuss birth control counseling.  She is s/p vaginal delivery in 10/27/2019 and currently exclusively breast-feeding.  Non-smoker with past medical history including psoriasis.  She has not been sexually active since the birth of her child in January, would like to get started on something first prior to activity.  During her recent postpartum visit she was interested in the Nexplanon, however presents today to discuss doing a birth control pills in the interim until she can have an implant (Nexplanon or IUD) placed.  She [facility] health department and [facility], says it would be "way too long "until they have anything that she could get scheduled for.  She does not have any insurance.  PERTINENT  PMH / PSH: Psoriasis, history of severe preeclampsia and AMA  OBJECTIVE:   BP 120/80   Pulse 98   Ht 5' 1.81" (1.57 m)   Wt 140 lb (63.5 kg)   SpO2 98%   BMI 25.76 kg/m   General: Alert, NAD HEENT: NCAT, MMM Lungs: no increased WOB  Abdomen: soft Msk: Moves all extremities spontaneously   Upreg negative in the office   ASSESSMENT/PLAN:   Birth control counseling Discussed risks and benefits of available contraceptive methods, she would like to proceed with Mirena IUD placement and use POPs in the interim until this can be scheduled. Provided with Mirena scholarship paperwork to complete for financial coverage and bring back to our office as she is uninsured.  Sent in Micronor (while she is >3 months pp, patient did not want to trial CHCs and risk any impact in her milk production) and instructed use of backup contraception for the next 7 days if sexually active.  Psoriasis Refilled clobetasol.    Bring in paperwork when completed, will schedule for Mirena following approval.   Allayne Stack, DO [facility]

## 2020-02-01 ENCOUNTER — Encounter: Payer: Self-pay | Admitting: Family Medicine

## 2020-02-01 DIAGNOSIS — Z3009 Encounter for other general counseling and advice on contraception: Secondary | ICD-10-CM | POA: Insufficient documentation

## 2020-02-01 NOTE — Assessment & Plan Note (Signed)
Discussed risks and benefits of available contraceptive methods, she would like to proceed with Mirena IUD placement and use POPs in the interim until this can be scheduled. Provided with Mirena scholarship paperwork to complete for financial coverage and bring back to our office as she is uninsured.  Sent in Micronor (while she is >3 months pp, patient did not want to trial CHCs and risk any impact in her milk production) and instructed use of backup contraception for the next 7 days if sexually active.

## 2020-02-01 NOTE — Assessment & Plan Note (Signed)
Refilled clobetasol.

## 2020-05-08 ENCOUNTER — Ambulatory Visit (INDEPENDENT_AMBULATORY_CARE_PROVIDER_SITE_OTHER): Payer: Self-pay | Admitting: Family Medicine

## 2020-05-08 ENCOUNTER — Encounter: Payer: Self-pay | Admitting: Family Medicine

## 2020-05-08 ENCOUNTER — Other Ambulatory Visit: Payer: Self-pay

## 2020-05-08 VITALS — BP 133/85 | HR 80 | Ht 61.42 in | Wt 135.0 lb

## 2020-05-08 DIAGNOSIS — R42 Dizziness and giddiness: Secondary | ICD-10-CM

## 2020-05-08 DIAGNOSIS — H8111 Benign paroxysmal vertigo, right ear: Secondary | ICD-10-CM

## 2020-05-08 DIAGNOSIS — H8110 Benign paroxysmal vertigo, unspecified ear: Secondary | ICD-10-CM | POA: Insufficient documentation

## 2020-05-08 NOTE — Patient Instructions (Addendum)
It was good to see you today.  Thank you for coming in.  I think you have Vertigo.      You should be better in 1 week.   If you are not better by then or if you have then call here to make an appointment with Physical Therapist for Vertigo.  Things to do to help you feel better continue doing Epley Maneuver  Be Well, Jovita Kussmaul MD   Cmo realizar la [place] de Epley How to Perform the Epley Maneuver La maniobra de Epley es un ejercicio que [place] los sntomas del vrtigo. El vrtigo es la sensacin de que usted o todo lo que lo rodea se mueven cuando en realidad eso no sucede. Cuando una persona siente vrtigo, puede tener la sensacin de que la habitacin da vueltas y dificultad para caminar. Los [employer] son un poco diferentes al vrtigo. Cuando una persona est [place], puede tener inestabilidad o sensacin de desvanecimiento. Puede realizar [employer] en su casa cuando tenga los sntomas de vrtigo. Puede realizarla 3veces por da como mximo hasta que desaparezcan los sntomas. Aunque la [employer] Laboratories de Energy manager lo [place] del vrtigo durante algunas semanas, es posible que los sntomas vuelvan. Esta [employer]in sntomas del vrtigo, pero no los [place]. Cules son los riesgos? Si se realiza de forma correcta, la [place] de Epley se considera un procedimiento seguro. En ocasiones, puede provocar mareos o nuseas que desaparecen despus de un breve perodo. Si tiene otros sntomas, como cambios en la visin, debilidad o entumecimiento, deje de Clinical cytogeneticist y llame al mdico. Cmo realizar la Teacher, early years/pre de Epley 1. Sintese en el borde de una cama o una mesa con la espalda recta y las piernas extendidas o colgando sobre el borde de la cama o la mesa. 2. Gire la cabeza a medias hacia el odo o el lado afectado. 3. Recustese hacia atrs con la cabeza girada hasta que se encuentre recostado sobre la espalda. Es Environmental health practitioner una almohada debajo de los  hombros. 4. Mantenga esta posicin durante 30segundos. Es posible que tenga una crisis de vrtigo. Esto es normal. 5. Gire la cabeza en direccin opuesta hasta que el odo no afectado est orientado al suelo. 6. Mantenga esta posicin durante 30segundos. Es posible que tenga una crisis de vrtigo. Esto es normal. Mantenga esta posicin hasta que el vrtigo desaparezca. 7. Gire todo el cuerpo hacia el mismo lado que la cabeza. Mantenga esta posicin durante otros 30segundos. 8. Vuelva a sentarse. Puede repetir este ejercicio hasta 3veces por da. Siga estas indicaciones en su casa:  Puede retomar sus actividades normales despus de Clinical cytogeneticist de Energy manager.  Pregntele al mdico si debe hacer algo en su casa para evitar el vrtigo. El mdico puede recomendarle lo siguiente: ? Photographer cabeza levantada (elevada) con dos o ms almohadas mientras duerme. ? No dormir sobre el lado del odo afectado. ? Levantarse lentamente de la cama. ? Evitar los movimientos repentinos Administrator. ? [employer] movimientos de cabeza intensos, como mirar [place] arriba o Public librarian. Comunquese con un mdico si:  El vrtigo empeora.  Tiene otros sntomas, como los siguientes: ? Nuseas. ? Vmitos. ? Dolor de [place]. Solicite ayuda de inmediato si:  Nota cambios en la visin.  Sufre un dolor de cabeza o en el cuello intenso o que [place].  No puede dejar de vomitar.  Siente entumecimiento o debilidad nuevos en alguna parte del cuerpo. Resumen  El vrtigo es la sensacin de que usted  o todo lo que lo rodea se mueven cuando en realidad eso no sucede.  La [employer] de Epley es un ejercicio que [place] los sntomas del vrtigo.  Si se realiza de forma correcta, la [place] de Epley se considera un procedimiento seguro. Puede realizarla 3veces por da como mximo. Esta informacin no tiene Theme park manager el consejo del mdico. Asegrese de hacerle al mdico cualquier pregunta que  tenga. Document Revised: 06/02/2017 Document Reviewed: 06/02/2017 Elsevier Patient Education  2020 [employer].

## 2020-05-08 NOTE — Progress Notes (Signed)
    SUBJECTIVE:   CHIEF COMPLAINT / HPI: Diziness  April Taylor is a 34 yo female with complaints of diziness.   Indicates it feels like the room is spinning when she is changing from sitting to laying down or sitting to standing and occasionally when she is walking around.  She denies any feelings of syncop or n/v.  Does endorse some blurry vision when diziness occurs.  Denies any weakness, difficulty hearing, or new headaches since symtoms started.  Denies fatigue but does endorse some difficulty sleeping.  Recently gave birth 6 months ago.  Denies any hot/cold insensitivities.  Drinks 2-3 bottles of water a day and has good appetitie.  No recent changes in weight.  No tripping or loss of motor control.  Denies any numbness/tingling or loss of sensation  PERTINENT  PMH / PSH:   OBJECTIVE:   BP 133/85   Pulse 80   Ht 5' 1.42" (1.56 m)   Wt 135 lb (61.2 kg)   SpO2 98%   BMI 25.16 kg/m     Physical Exam Constitutional:      General: She is not in acute distress.    Appearance: She is not ill-appearing.  HENT:     Head: Normocephalic and atraumatic.     Mouth/Throat:     Mouth: Mucous membranes are moist.     Pharynx: No oropharyngeal exudate or posterior oropharyngeal erythema.  Eyes:     Extraocular Movements: Extraocular movements intact.     Pupils: Pupils are equal, round, and reactive to light.  Cardiovascular:     Rate and Rhythm: Normal rate and regular rhythm.  Pulmonary:     Effort: Pulmonary effort is normal.     Breath sounds: Normal breath sounds.  Musculoskeletal:        General: Normal range of motion.     Cervical back: Normal range of motion. No tenderness.     Right lower leg: No edema.     Left lower leg: No edema.  Neurological:     General: No focal deficit present.     Mental Status: She is alert.     Cranial Nerves: No cranial nerve deficit.     Sensory: Sensation is intact. No sensory deficit.     Motor: No weakness.     Gait: Gait is  intact.     Comments: Patient dizziness recreated with Dix-Hallpike maneuver with turning head to right.  Nystagmus also observed on this side.     ASSESSMENT/PLAN:   Vertigo, benign paroxysmal Dix Hallpike positive on right side for recreation of symptoms and nystagmus.  - Performed Epley maneuver on patient in clinic - Gave patient instructions in Spanish for Epley maneuver to do at home - Instructed patient to call in 1 week if symptoms do not improve to schedule an appointment with PT     Jovita Kussmaul, MD [facility] Medicine [facility]

## 2020-05-08 NOTE — Assessment & Plan Note (Signed)
Dix Hallpike positive on right side for recreation of symptoms and nystagmus.  - Performed Epley maneuver on patient in clinic - Gave patient instructions in Spanish for Epley maneuver to do at home - Instructed patient to call in 1 week if symptoms do not improve to schedule an appointment with PT

## 2020-06-11 LAB — RPR: RPR Ser Ql: NONREACTIVE — AB

## 2020-12-31 NOTE — Progress Notes (Signed)
    SUBJECTIVE:   CHIEF COMPLAINT / HPI: left breat pain  Patient reports left side breast pain and redness that started two months ago. She is currently breast feeding and has not stopped due to the pain.  Denies any fevers, nipple discharge other than expressed milk.  She does notice some nipple bleeding after baby has fed but this is very rare.  She has not noticed any lumps or bumps around the area.  Has not used any method to hlp relieve pain or discomfort.  PERTINENT  PMH / PSH:  None  OBJECTIVE:   BP 136/72   Pulse (!) 102   Wt 136 lb 9.6 oz (62 kg)   LMP 12/30/2020 (Exact Date)   SpO2 99%   BMI 25.46 kg/m    General: Alert, no acute distress Cardio: Normal S1 and S2, RRR, no r/m/g Pulm: CTAB, normal work of breathing Left Breast: normal breastfeeding nipple discharge noted, no skin changes, warmth or erythema.  No palpable masses or lumps. No axillary lymphadenopathy appreciated.   ASSESSMENT/PLAN:   Breast pain, left Likely blocked mammary duct.  Exam positive for tenderness noted underneath left nipple and lower portion of breast tissue.  No family history breast cancer so less likely and no masses or lymphadenopathy appreciated. -Continue to breast feed as normal -Warm compresses 3-4 times daily -Continue to use massage techniques to help express and open ducts -Will obtain breast u/s for confirmation  -Follow up with PCP as needed     Dana Allan, MD [facility]

## 2021-01-01 ENCOUNTER — Ambulatory Visit (INDEPENDENT_AMBULATORY_CARE_PROVIDER_SITE_OTHER): Payer: Self-pay | Admitting: Family Medicine

## 2021-01-01 ENCOUNTER — Other Ambulatory Visit: Payer: Self-pay

## 2021-01-01 VITALS — BP 136/72 | HR 102 | Wt 136.6 lb

## 2021-01-01 DIAGNOSIS — N644 Mastodynia: Secondary | ICD-10-CM

## 2021-01-01 NOTE — Patient Instructions (Addendum)
Amamantamiento y mastitis Breastfeeding and Mastitis La mastitis es una inflamacin del tejido [place]. Puede aparecer en las mujeres que estn amamantando. Esto puede hacer que el amamantamiento sea doloroso. A veces, la mastitis desaparece sin tratamiento, especialmente si no es causada por una infeccin (mastitis no infecciosa). El mdico determinar si es necesario un tratamiento. Puede requerirse tratamiento si la afeccin es causada por una infeccin bacteriana (mastitis infecciosa). Cules son las causas? La inflamacin puede estar causada por un conducto deleche obstruido, que puede ocurrir cuando se acumula demasiada [employer] Energy [employer]. Las causas del exceso de [employer] en la mama pueden ser:  Zenovia Jarred prensin. Si el beb no se prende correctamente a la mama, es probable que no vace la mama por completo al [employer]ALCOMM.  Dejar pasar demasiado tiempo entre tomas o programar tomas en lugar de alimentar cuando el beb lo pida.  Usar un sostn u otras prendas que sean muy [place]. Esto ejerce ms presin en los conductos galactforos, lo cual ocasiona que la McDonald no fluya por ellos como debera.  Remanente de [facility] mama que est sobrecargada (congestionada). Esto se debe a menudo al [employer] de suministro u omisin de tomas. Generalmente, la causa de la mastitis es una infeccin bacteriana. Las bacterias que causan la infeccin pueden entrar al tejido Thermal a travs de cortes, grietas u orificios en la piel cerca del rea del pezn. Las grietas en la piel pueden producirse cuando el beb no se prende correctamente a la mama. Qu incrementa el riesgo? Los siguientes factores pueden hacer que sea ms propensa a Aeronautical engineer afeccin:  Antecedentes de mastitis.  Mala tcnica de lactancia.  Mala nutricin e hidratacin.  Fumar.  Estrs.  Cansancio (fatiga). Cules son los signos o sntomas? Los sntomas de esta afeccin incluyen:  Hinchazn, enrojecimiento, sensibilidad y Engineer, mining  en una zona de la mama. Por lo general, estos sntomas afectan la parte superior de la mama, hacia la regin de la [place]. En la [employer]ines, estos sntomas afectan solo [place]. En algunos casos, los sntomas pueden ocurrir en ambas mamas simultneamente y Audiological scientist una parte ms grande de tejido [place].  Hinchazn de los ganglios que se encuentran debajo del brazo, en el mismo lado.  Fatiga, dolor de [place] y dolores musculares similares a los que se experimentan cuando se tiene gripe.  Grant Ruts.  Pulso rpido. Los sntomas generalmente duran de 2 a [address]. El dolor y Dance movement psychotherapist enrojecimiento de las mamas alcanzan su mayor intensidad el da2 y el da3, y generalmente desaparecen el da5. Si se deja que la infeccin empeore, puede formarse una acumulacin de pus o un absceso.   Cmo se diagnostica? Esta afeccin, usualmente, se diagnostica en funcin de lo siguiente:  Sus sntomas.  Un examen fsico. En algunos casos, se pueden realizar estudios, por ejemplo:  Anlisis de sangre para determinar si el cuerpo est combatiendo una infeccin bacteriana.  Thereasa Solo o una ecografa para descartar otros problemas o enfermedades.  Pruebas del lquido. Si tiene un absceso, pueden retirarle el lquido con [place]. El lquido puede analizarse para determinar si hay presencia de bacterias.  Anlisis de la [employer]gate Pal[employer]. Se puede analizar una muestra de la [place] materna para detectar la presencia de bacterias. Cmo se trata? A veces, esta afeccin desaparece sin tratamiento al amamantar o extraer Alexis Goodell de forma continua. El mdico podr indicarle que espere 24horas despus de verla por primera vez para decidir si necesita tratamiento. Si se necesita tratamiento, [address]  puede incluir lo siguiente:  Nurse, learning disability o extraerse W[employer] Resources de ambas mamas para permitir el flujo adecuado de la [place] y Automotive engineer que se forme un absceso. Las estrategias para favorecer el amamantamiento incluyen lo  siguiente: ? Museum/gallery exhibitions officer primero del lado afectado. ? Masajear las Con[employer]ra Foods. ? Aplicar calor en la zona afectada antes de amamantar. Esto puede ayudar con el flujo de [place]. Pruebe con un pao tibio o una ducha caliente.  Cuidado personal, como descansar y beber ms lquidos.  Tomar analgsicos.  Antibiticos para tratar la infeccin bacteriana.  Usar una aguja para extraer lquido de un absceso, si es que se ha formado uno. Siga estas instrucciones en su casa: Medicamentos  Use los medicamentos de venta libre y los recetados solamente como se lo haya indicado el mdico.  Si le recetaron un antibitico, tmelo como se lo haya indicado el mdico. No deje de tomar el antibitico aunque comience a sentirse mejor. Control del dolor y de la hinchazn Si se le indica, coloque hielo en la zona afectada de la mama, inmediatamente despus de Museum/gallery exhibitions officer o extraer [place]. Para hacer esto:  Ponga el hielo en una bolsa plstica.  Coloque una toalla entre la piel y Copy.  Aplique el hielo durante , 2 o 3veces por da.  Retire el hielo si la piel se le pone de color rojo brillante. Esto es Intel.   Cuidado de las CSX Corporation pezones secos y limpios.  Si debe regresar a Printmaker, use un sacaleche mientras est en el trabajo para mantener su cronograma de Market researcher.  No permita que las mamas se congestionen.  No use un sostn demasiado ajustado o con aro. Use un sostn blando, de soporte.  Revsese los pezones para ver si tienen grietas o ampollas. Si encuentra alguna, hable con el mdico o con un especialista en lactancia (asesor en Market researcher) para determinar cul ser [employer]. Consejos para amamantar  Contine vaciando las mamas con la mayor frecuencia posible, ya sea amamantando o usando un Counsellor. Esto es muy importante para prevenir la mastitis o para tratarla. Pregunte a su mdico si debe hacer cambios en su rutina de amamantamiento o  extraccin de [place].  Si se le indica, aplique calor hmedo en el rea afectada de la mama, justo antes de Museum/gallery exhibitions officer o de extraer [place]. Use la fuente de calor que el mdico le haya recomendado.  Durante el amamantamiento, vace la primera mama completamente antes de amamantar con la otra. Si el beb no vaca las mamas en su totalidad, utilice un sacaleche para vaciarlas.  Realice un masaje en la mama durante las sesiones de Librarian, academic o de extraccin de [place]. Instrucciones generales  Beber suficiente lquido como para mantener la orina de color amarillo plido. Esto es muy importante si tiene fiebre.  Descanse mucho.  Lvese las manos frecuentemente con agua y jabn durante al menos 20segundos.  Lave los componentes del sacaleche con agua caliente y [place].  Cumpla con todas las visitas de seguimiento. Esto es importante. Dnde buscar ms informacin  [employer] Auto (Liga Internacional de [place]): llli.org Comunquese con un mdico si:  Le Dance movement psychotherapist secrecin similar al pus por la mama.  Tiene fiebre.  Los sntomas no mejoran en el trmino de 2das despus de Programmer, systems.  Los sntomas reaparecen despus de que se ha recuperado de una infeccin [place]. Solicite ayuda de inmediato si:  El dolor y Furniture conservator/restorer.  Tiene dolor y no puede  controlarlo con la medicacin.  Observa una lnea roja que se extiende desde la mama hasta la axila. Resumen  La mastitis es una inflamacin del tejido [address]. A menudo es causada por bacterias o un conducto galactforo obstruido.  Si es necesario, el tratamiento puede incluir continuar amamantando o extrayndose la [place], Corporate treasurer o fro y otras estrategias de [place], cuidado personal y Media planner.  Si le recetaron un antibitico, tmelo como se lo haya indicado el mdico. No deje de tomar el antibitico aunque comience a sentirse mejor.  Contine vaciando las mamas con la mayor  frecuencia posible, ya sea amamantando o usando un Counsellor. Esta informacin no tiene Theme park manager el consejo del mdico. Asegrese de hacerle al mdico cualquier pregunta que tenga. Document Revised: 03/13/2020 Document Reviewed: 03/13/2020 Elsevier Patient Education  2021 [employer].

## 2021-01-04 ENCOUNTER — Encounter: Payer: Self-pay | Admitting: Family Medicine

## 2021-01-04 DIAGNOSIS — N644 Mastodynia: Secondary | ICD-10-CM | POA: Insufficient documentation

## 2021-01-04 NOTE — Assessment & Plan Note (Signed)
Likely blocked mammary duct.  Exam positive for tenderness noted underneath left nipple and lower portion of breast tissue.  No family history breast cancer so less likely and no masses or lymphadenopathy appreciated. -Continue to breast feed as normal -Warm compresses 3-4 times daily -Continue to use massage techniques to help express and open ducts -Will obtain breast u/s for confirmation  -Follow up with PCP as needed

## 2021-02-18 ENCOUNTER — Other Ambulatory Visit: Payer: Self-pay | Admitting: Family Medicine

## 2021-07-24 ENCOUNTER — Other Ambulatory Visit: Payer: Self-pay

## 2021-07-24 ENCOUNTER — Ambulatory Visit (INDEPENDENT_AMBULATORY_CARE_PROVIDER_SITE_OTHER): Payer: Self-pay | Admitting: Student

## 2021-07-24 ENCOUNTER — Encounter: Payer: Self-pay | Admitting: Student

## 2021-07-24 VITALS — BP 150/101 | HR 95 | Ht 61.0 in | Wt 139.6 lb

## 2021-07-24 DIAGNOSIS — L409 Psoriasis, unspecified: Secondary | ICD-10-CM

## 2021-07-24 MED ORDER — CLOBETASOL PROPIONATE 0.05 % EX OINT: 1.0000 "application " | TOPICAL_OINTMENT | Freq: Two times a day (BID) | CUTANEOUS | 0 refills | Status: DC

## 2021-07-24 NOTE — Patient Instructions (Signed)
Ms. Cecilie Lowers un placer atenderte hoy. Creo que la causa de que se te caiga el cabello es que te rascas el punto de inflamacin en la parte superior del cuero cabelludo. Segn lo que puedo ver en el examen, creo que lo ms probable es que se trate de una placa de psoriasis. Es razonablemente pequeo y debera responder bien a la pomada de clobetasol que ha usado en el pasado. Estoy enviando esta receta a su farmacia. Puedes aplicar [employer] al da. Si no ve ninguna mejora en las prximas 2 semanas, regrese a Technical sales engineer.  Estamos felices de verte en cualquier momento, Dr. Marisue Humble  It was a pleasure taking care of you today.  I think that the cause of your hair falling out is from you scratching the spot of inflammation at the top of your scalp.  Based on what I can see on exam I think this is most likely a psoriasis plaque.  It is reasonably small and should respond well to the clobetasol ointment that you have used in the past.  I am sending this prescription to your pharmacy.  You can apply this twice per day.  If you do not see any improvement in the next 2 weeks please return to our clinic.  We are happy to see you anytime, Dr. Marisue Humble

## 2021-07-24 NOTE — Progress Notes (Signed)
    SUBJECTIVE:   CHIEF COMPLAINT / HPI:   Hair Loss Patient reports that since July she has noticed increased hair in her April Taylor and thinks that it is coming from a single area on her head that has been itchy.  She has a history of psoriasis with plaques on her back and at the base of her scalp.  She has not tried putting anything on the area.  Denies any other areas of hair loss.  Does not have a family history of early hair loss.  Denies any symptoms of heat/cold intolerance, palpitations, fatigue.  PERTINENT  PMH / PSH: Psoriasis of scalp  OBJECTIVE:   BP (!) 150/101   Pulse 95   Ht 5\' 1"  (1.549 m)   Wt 139 lb 9.6 oz (63.3 kg)   LMP 07/23/2021   SpO2 99%   BMI 26.38 kg/m   Physical Exam HENT:     Head:      Comments: 2 cm patch of thickened erythematous scalp with associated scale. Hair follicles over patch appear otherwise normal. Woods lamp exam with no fluorescence.  Tests: Hair pull test negative  ASSESSMENT/PLAN:   Psoriasis Expect that her perceived increased hair loss is due to mechanical disruption of hair fibers from excessive scratching of psoriasis plaque.  As her hair follicles appear intact and she had a negative hair pull test I am not concerned for alopecia.  She is not exhibiting any other signs or symptoms of thyroid disorders though if symptoms do not improve could consider TSH and free T4. She has successfully treated for psoriasis in the past with clobetasol ointment but found it to be prohibitively expensive. -Rx for clobetasol ointment sent to [facility]; Pt provided with GoodRx coupon for $6.72 -Return as needed if symptoms do not improve     [facility], MD [facility]

## 2021-07-24 NOTE — Assessment & Plan Note (Addendum)
Expect that her perceived increased hair loss is due to mechanical disruption of hair fibers from excessive scratching of psoriasis plaque.  As her hair follicles appear intact and she had a negative hair pull test I am not concerned for alopecia.  She is not exhibiting any other signs or symptoms of thyroid disorders though if symptoms do not improve could consider TSH and free T4. She has successfully treated for psoriasis in the past with clobetasol ointment but found it to be prohibitively expensive. -Rx for clobetasol ointment sent to [facility]; Pt provided with GoodRx coupon for $6.72 -Return as needed if symptoms do not improve

## 2021-12-23 ENCOUNTER — Other Ambulatory Visit: Payer: Self-pay | Admitting: Family Medicine

## 2022-07-30 ENCOUNTER — Ambulatory Visit: Payer: Self-pay

## 2023-07-27 ENCOUNTER — Encounter: Payer: Self-pay | Admitting: Nurse Practitioner

## 2023-10-29 ENCOUNTER — Ambulatory Visit (INDEPENDENT_AMBULATORY_CARE_PROVIDER_SITE_OTHER): Payer: Self-pay | Admitting: Nurse Practitioner

## 2023-10-29 ENCOUNTER — Encounter: Payer: Self-pay | Admitting: Nurse Practitioner

## 2023-10-29 ENCOUNTER — Other Ambulatory Visit (INDEPENDENT_AMBULATORY_CARE_PROVIDER_SITE_OTHER): Payer: Self-pay

## 2023-10-29 VITALS — BP 122/76 | HR 102 | Ht 60.75 in | Wt 131.2 lb

## 2023-10-29 DIAGNOSIS — R1011 Right upper quadrant pain: Secondary | ICD-10-CM

## 2023-10-29 DIAGNOSIS — K769 Liver disease, unspecified: Secondary | ICD-10-CM

## 2023-10-29 DIAGNOSIS — K8020 Calculus of gallbladder without cholecystitis without obstruction: Secondary | ICD-10-CM

## 2023-10-29 DIAGNOSIS — R140 Abdominal distension (gaseous): Secondary | ICD-10-CM

## 2023-10-29 LAB — COMPREHENSIVE METABOLIC PANEL
ALT: 9 U/L (ref 0–35)
AST: 15 U/L (ref 0–37)
Albumin: 4.9 g/dL (ref 3.5–5.2)
Alkaline Phosphatase: 57 U/L (ref 39–117)
BUN: 7 mg/dL (ref 6–23)
CO2: 25 meq/L (ref 19–32)
Calcium: 10 mg/dL (ref 8.4–10.5)
Chloride: 103 meq/L (ref 96–112)
Creatinine, Ser: 0.54 mg/dL (ref 0.40–1.20)
GFR: 115.66 mL/min (ref >60.00)
Glucose, Bld: 88 mg/dL (ref 70–99)
Potassium: 3.7 meq/L (ref 3.5–5.1)
Sodium: 136 meq/L (ref 135–145)
Total Bilirubin: 0.3 mg/dL (ref 0.2–1.2)
Total Protein: 8.2 g/dL (ref 6.0–8.3)

## 2023-10-29 LAB — CBC WITH DIFFERENTIAL/PLATELET
Basophils Absolute: 0 10*3/uL (ref 0.0–0.1)
Basophils Relative: 0.6 % (ref 0.0–3.0)
Eosinophils Absolute: 0.2 10*3/uL (ref 0.0–0.7)
Eosinophils Relative: 2.2 % (ref 0.0–5.0)
HCT: 39.4 % (ref 36.0–46.0)
Hemoglobin: 12.7 g/dL (ref 12.0–15.0)
Lymphocytes Relative: 16.8 % (ref 12.0–46.0)
Lymphs Abs: 1.2 10*3/uL (ref 0.7–4.0)
MCHC: 32.2 g/dL (ref 30.0–36.0)
MCV: 77.8 fL — ABNORMAL LOW (ref 78.0–100.0)
Monocytes Absolute: 0.4 10*3/uL (ref 0.1–1.0)
Monocytes Relative: 5.9 % (ref 3.0–12.0)
Neutro Abs: 5.4 10*3/uL (ref 1.4–7.7)
Neutrophils Relative %: 74.5 % (ref 43.0–77.0)
Platelets: 309 10*3/uL (ref 150.0–400.0)
RBC: 5.06 Mil/uL (ref 3.87–5.11)
RDW: 15.1 % (ref 11.5–15.5)
WBC: 7.2 10*3/uL (ref 4.0–10.5)

## 2023-10-29 MED ORDER — FAMOTIDINE 20 MG PO TABS: 20.0000 mg | ORAL_TABLET | Freq: Every day | ORAL | 1 refills | Status: AC

## 2023-10-29 NOTE — Patient Instructions (Addendum)
 Your provider has requested that you go to the basement level for lab work before leaving today. Press B on the elevator. The lab is located at the first door on the left as you exit the elevator.  We have sent the following medications to your pharmacy for you to pick up at your convenience: Famotidine  20 mg- take one by mouth daily for gastritis  Your provider has ordered Diatherix stool testing for you. You have received a kit from our office today containing all necessary supplies to complete this test. Please carefully read the stool collection instructions provided in the kit before opening the accompanying materials. In addition, be sure to place the label from the top right corner of the laboratory request sheet onto the puritan opti-swab tube that is supplied in the kit. This label should include your full name and date of birth. After completing the test, you should secure the purtian tube into the specimen biohazard bag. The laboratory request information sheet (including date and time of specimen collection) should be placed into the outside pocket of the specimen biohazard bag and returned to the [place] lab with 2 days of collection.   If the laboratory information sheet specimen date and time are not filled out, the test will NOT be performed.  Follow up to be determined once lab results are reviewed.  Due to recent changes in healthcare laws, you may see the results of your imaging and laboratory studies on MyChart before your provider has had a chance to review them.  We understand that in some cases there may be results that are confusing or concerning to you. Not all laboratory results come back in the same time frame and the provider may be waiting for multiple results in order to interpret others.  Please give us  48 hours in order for your provider to thoroughly review all the results before contacting the office for clarification of your results.   Thank you for trusting me  with your gastrointestinal care!   Elida Shawl, CRNP

## 2023-10-29 NOTE — Progress Notes (Unsigned)
 Marland Kitchen

## 2023-10-29 NOTE — Progress Notes (Signed)
 10/29/2023 April Taylor [id number] 1986/03/17   CHIEF COMPLAINT: Abdominal distension, gallstones   HISTORY OF PRESENT ILLNESS: April Taylor is a 38 year old female with a past medical history of psoriasis, pretension, lipidemia, pre-eclampsia third trimester of pregnancy 2021. She presents to our office today as referred by Dr. Lynwood Gasman for further evaluation regarding abdominal bloat, gallstones and a liver lesion seen on an abdominal sonogram.  She speaks Spanish therefore she is accompanied by [facility] Spanish interpreter who facilitates communication throughout today's office visit.  She developed RUQ pain around 01/2023 and underwent an abdominal sonogram 04/24/2023 which showed gallstones and an 8 mm echogenic lesion in the right hepatic lobe which was incompletely characterized, possible hemangioma and a 3.4 cm left ovarian cyst was noted.  She stated she was prescribed a medication for heartburn but did not take it because she was not having heartburn.  She denies having any significant RUQ pain but describes feeling inflammation to the RUQ area which typically occurs when eating.  No specific food triggers.  No nausea or vomiting.  No heartburn or dysphagia.  She passes normal brown bowel movement daily.  No bloody or black stools.  She takes Advil  for headaches once monthly.  She denies undergoing any recent laboratory studies.  No known family history of esophageal, gastric or colon cancer.  Patient is uninsured.  She has [employer] financial assistance orange card.  RUQ sonogram at [facility] 04/24/2023: 2 mobile gallstones are present.  No gallbladder wall thickening.  Negative sonographic Murphy sign. CBD measures 3 mm. No intrahepatic biliary ductal dilatation. Hyperechoic focus in the right hepatic lobe measuring 8 mm, incompletely characterized but may relate to hemangioma Normal spleen.  3.4 cm left ovarian cyst    Past Medical History:   Diagnosis Date   Retained placenta 10/25/2019   Supervision of normal pregnancy 12/30/2010   Clinic [facility] Prenatal Labs Dating LMP 01/20/19 Blood type: A/Positive/-- (06/19 1046)  Genetic Screen 1 Screen:    AFP:     Quad:     NIPS: Declined Antibody:Negative (06/19 1046) Anatomic US  Female  Rubella: 6.68 (06/19 1046) GTT Elevated, scheduled for 3 hour- normal  RPR: Non Reactive (10/15 1058)  Flu vaccine 08/18/19 HBsAg: Negative (06/19 1046)  TDaP vaccine        08/18/19                 Rhogam   Past Surgical History:  Procedure Laterality Date   NO PAST SURGERIES     Social History: She is married.  She has 3 sons.  Nonsmoker. No alcohol use. No drug use.   Family History: Mother with osteoarthritis.  No known family history of esophageal, gastric or colon cancer.  No Known Allergies    Outpatient Encounter Medications as of 10/29/2023  Medication Sig   acetaminophen  (TYLENOL ) 325 MG tablet Take 650 mg by mouth every 6 (six) hours as needed.   clobetasol  (TEMOVATE ) 0.05 % external solution APPLY TO THE AFFECTED AREA TOPICALLY TWICE DAILY.   clobetasol  ointment (TEMOVATE ) 0.05 % Apply 1 application topically 2 (two) times daily.   norethindrone  (ORTHO MICRONOR ) 0.35 MG tablet Take 1 tablet (0.35 mg total) by mouth daily.   Prenatal Vit-Fe Fumarate-FA (MULTIVITAMIN-PRENATAL) 27-0.8 MG TABS tablet Take 1 tablet by mouth daily at 12 noon.   triamcinolone  ointment (KENALOG ) 0.1 % Apply 1 application topically 2 (two) times daily.   No facility-administered encounter medications on file as of 10/29/2023.  REVIEW OF SYSTEMS:  Gen: Denies fever, sweats or chills. No weight loss.  CV: Denies chest pain, palpitations or edema. Resp: Denies cough, shortness of breath of hemoptysis.  GI: See HPI.  GU: Denies urinary burning, blood in urine, increased urinary frequency or incontinence. MS: Denies joint pain, muscles aches or weakness. Derm: Denies rash, itchiness, skin lesions or unhealing  ulcers. Psych: Denies depression, anxiety, memory loss or confusion. Heme: Denies bruising, easy bleeding. Neuro:  Denies headaches, dizziness or paresthesias. Endo:  Denies any problems with DM, thyroid or adrenal function.  PHYSICAL EXAM: BP 122/76   Pulse (!) 102   Ht 5' 0.75 (1.543 m) Comment: Measured in office today  Wt 131 lb 4 oz (59.5 kg)   SpO2 94%   BMI 25.00 kg/m  Utilizes Nexplanon for birth control General: 38 year old female in no acute distress. Head: Normocephalic and atraumatic. Eyes:  Sclerae non-icteric, conjunctive pink. Ears: Normal auditory acuity. Mouth: Dentition intact. No ulcers or lesions.  Neck: Supple, no lymphadenopathy or thyromegaly.  Lungs: Clear bilaterally to auscultation without wheezes, crackles or rhonchi. Heart: Regular rate and rhythm. No murmur, rub or gallop appreciated.  Abdomen: Soft, nontender, nondistended. No masses. No hepatosplenomegaly. Normoactive bowel sounds x 4 quadrants.  Rectal: Deferred. Musculoskeletal: Symmetrical with no gross deformities. Skin: Warm and dry. No rash or lesions on visible extremities. Extremities: No edema. Neurological: Alert oriented x 4, no focal deficits.  Psychological:  Alert and cooperative. Normal mood and affect.  ASSESSMENT AND PLAN:  38 year old female with RUQ pain without nausea or vomiting.  RUQ pain initially started 01/2023 and has diminished but has not completely abated with associated abdominal bloat.  RUQ sonogram showed gallstones without evidence of acute cholecystitis and no biliary ductal dilatation. -CBC, CMP -Famotidine  20 mg 1 tab p.o. daily -GERD/ulcer diet handout -Avoid NSAIDs -Diatherix H. pylori stool antigen -Require EGD and CCK HIDA scan if symptoms persist or worsen -Other recommendations to be determined after the above lab results reviewed  8 mm right liver lesion, possible hemangioma abdominal sonogram 04/24/2023 -LFTs as ordered above -Eventual liver MRI to  further characterize liver lesion   CC:  Marlee Lynwood NOVAK, MD

## 2023-10-30 ENCOUNTER — Other Ambulatory Visit: Payer: Self-pay

## 2023-10-30 ENCOUNTER — Other Ambulatory Visit (INDEPENDENT_AMBULATORY_CARE_PROVIDER_SITE_OTHER): Payer: Self-pay

## 2023-10-30 ENCOUNTER — Ambulatory Visit: Payer: Self-pay

## 2023-10-30 DIAGNOSIS — K769 Liver disease, unspecified: Secondary | ICD-10-CM

## 2023-10-30 DIAGNOSIS — R718 Other abnormality of red blood cells: Secondary | ICD-10-CM

## 2023-10-30 DIAGNOSIS — R140 Abdominal distension (gaseous): Secondary | ICD-10-CM

## 2023-10-30 DIAGNOSIS — K8020 Calculus of gallbladder without cholecystitis without obstruction: Secondary | ICD-10-CM

## 2023-10-30 DIAGNOSIS — R1011 Right upper quadrant pain: Secondary | ICD-10-CM

## 2023-10-30 LAB — IBC + FERRITIN
Ferritin: 4.3 ng/mL — ABNORMAL LOW (ref 10.0–291.0)
Iron: 46 ug/dL (ref 42–145)
Saturation Ratios: 8.9 % — ABNORMAL LOW (ref 20.0–50.0)
TIBC: 516.6 ug/dL — ABNORMAL HIGH (ref 250.0–450.0)
Transferrin: 369 mg/dL — ABNORMAL HIGH (ref 212.0–360.0)

## 2023-11-02 NOTE — Progress Notes (Signed)
 Agree with the assessment and plan as outlined by Alcide Evener, NP.    Zae Kirtz E. Tomasa Rand, MD [facility] Gastroenter[facility]

## 2023-11-03 ENCOUNTER — Telehealth: Payer: Self-pay

## 2023-11-03 NOTE — Telephone Encounter (Signed)
 Contacted [facility] Interpreter services and interpreter attempted to contact patient regarding negative Diatherix h pylori stool test.

## 2023-11-03 NOTE — Progress Notes (Signed)
 April Taylor, please have [facility] Spanish interpreter contact the patient and let her know her iron saturation ratios are low and her ferritin level which reflects the liver storage of iron is low.  Her serum iron level is normal.  Her hemoglobin is normal at 12.7.  No anemia.  Please verify if the patient has heavy menstrual cycles.   I think it is best to schedule patient for an EGD at this juncture as she was seen in the office 10/29/2023 with RUQ pain.  Please schedule her for an EGD with Dr. Stacia if she is willing to do so.  If she wishes to discuss with me further, I will gladly call her or you can schedule her for a follow-up appointment to discuss face-to-face.  Thank you.  Dr. Stacia, please let me know if you have other recommendations,  thank you

## 2023-11-04 ENCOUNTER — Other Ambulatory Visit: Payer: Self-pay

## 2023-11-04 DIAGNOSIS — R790 Abnormal level of blood mineral: Secondary | ICD-10-CM

## 2023-11-04 DIAGNOSIS — R140 Abdominal distension (gaseous): Secondary | ICD-10-CM

## 2023-11-04 DIAGNOSIS — R1011 Right upper quadrant pain: Secondary | ICD-10-CM

## 2023-11-09 ENCOUNTER — Encounter: Payer: Self-pay | Admitting: Nurse Practitioner

## 2023-11-20 ENCOUNTER — Ambulatory Visit: Payer: Self-pay | Admitting: Gastroenterology

## 2023-11-20 ENCOUNTER — Encounter: Payer: Self-pay | Admitting: Gastroenterology

## 2023-11-20 VITALS — BP 144/100 | HR 91 | Temp 98.4°F | Resp 20 | Ht 60.75 in | Wt 131.0 lb

## 2023-11-20 DIAGNOSIS — D500 Iron deficiency anemia secondary to blood loss (chronic): Secondary | ICD-10-CM

## 2023-11-20 DIAGNOSIS — R1011 Right upper quadrant pain: Secondary | ICD-10-CM

## 2023-11-20 MED ORDER — SODIUM CHLORIDE 0.9 % IV SOLN: 500.0000 mL | Freq: Once | INTRAVENOUS | Status: DC

## 2023-11-20 NOTE — Progress Notes (Signed)
Pt's states no medical or surgical changes since previsit or office visit.  Interpreter used today at the [facility] for this pt.  Interpreter's name is-  Systems analyst

## 2023-11-20 NOTE — Progress Notes (Signed)
History and Physical Interval Note:  11/20/2023 9:33 AM  April Taylor  has presented today for endoscopic procedure(s), with the diagnosis of  Encounter Diagnosis  Name Primary?   RUQ pain Yes  .  The various methods of evaluation and treatment have been discussed with the patient and/or family. After consideration of risks, benefits and other options for treatment, the patient has consented to  the endoscopic procedure(s).   The patient's history has been reviewed, patient examined, no change in status, stable for endoscopic procedure(s).  I have reviewed the patient's chart and labs.  Questions were answered to the patient's satisfaction.     Chantry Headen E. Tomasa Rand, MD [facility]astroenterology

## 2023-11-20 NOTE — Progress Notes (Signed)
 Called to room to assist during endoscopic procedure.  Patient ID and intended procedure confirmed with present staff. Received instructions for my participation in the procedure from the performing physician.

## 2023-11-20 NOTE — Progress Notes (Signed)
 Sedate, gd SR, tolerated procedure well, VSS, report to RN

## 2023-11-20 NOTE — Op Note (Signed)
[facility] Patient Name: April Taylor Procedure Date: 11/20/2023 9:32 AM MRN: [id number] Endoscopist: Lorin Picket E. Tomasa Rand , MD, [id number] Age: 38 Referring MD:  Date of Birth: Sep 21, 1986 Gender: Female Account #: [id number] Procedure:                Upper GI endoscopy Indications:              Iron deficiency anemia, Abdominal bloating Medicines:                Monitored Anesthesia Care Procedure:                Pre-Anesthesia Assessment:                           - Prior to the procedure, a History and Physical                            was performed, and patient medications and                            allergies were reviewed. The patient's tolerance of                            previous anesthesia was also reviewed. The risks                            and benefits of the procedure and the sedation                            options and risks were discussed with the patient.                            All questions were answered, and informed consent                            was obtained. Prior Anticoagulants: The patient has                            taken no anticoagulant or antiplatelet agents. ASA                            Grade Assessment: II - A patient with mild systemic                            disease. After reviewing the risks and benefits,                            the patient was deemed in satisfactory condition to                            undergo the procedure.                           After obtaining informed consent, the endoscope was  passed under direct vision. Throughout the                            procedure, the patient's blood pressure, pulse, and                            oxygen saturations were monitored continuously. The                            Olympus Scope SN [id number] was introduced through the                            mouth, and advanced to the second part of duodenum.                             The upper GI endoscopy was accomplished without                            difficulty. The patient tolerated the procedure                            well. Scope In: Scope Out: Findings:                 The examined portions of the nasopharynx,                            oropharynx and larynx were normal.                           The examined esophagus was normal.                           The entire examined stomach was normal. Biopsies                            were taken with a cold forceps for Helicobacter                            pylori testing.                           The examined duodenum was normal. Biopsies for                            histology were taken with a cold forceps for                            evaluation of celiac disease. Complications:            No immediate complications. Estimated Blood Loss:     Estimated blood loss was minimal. Impression:               - The examined portions of the nasopharynx,  oropharynx and larynx were normal.                           - Normal esophagus.                           - Normal stomach. Biopsied.                           - Normal examined duodenum. Biopsied.                           - No abnormalities to explain abdominal bloating or                            iron deficiency. Suspect iron deficiency is                            secondary to menstrual blood loss. Recommendation:           - Patient has a contact number available for                            emergencies. The signs and symptoms of potential                            delayed complications were discussed with the                            patient. Return to normal activities tomorrow.                            Written discharge instructions were provided to the                            patient.                           - Resume previous diet.                           - Continue present medications.                            - Await pathology results.                           - Follow up as needed in the office. Osiris Odriscoll E. Tomasa Rand, MD 11/20/2023 9:58:48 AM This report has been signed electronically.

## 2023-11-20 NOTE — Patient Instructions (Addendum)
USTED TUVO UN PROCEDIMIENTO ENDOSCPICO HOY EN EL [facility]:   Lea el informe del procedimiento que se le entreg para cualquier pregunta especfica sobre lo que se Dentist.  Si el informe del examen no responde a sus preguntas, por favor llame a su gastroenterlogo para aclararlo.  Si usted solicit que no se le den [employer] de lo que se Clinical cytogeneticist en su procedimiento al [employer] va a cuidar, entonces el informe del procedimiento se ha incluido en un sobre sellado para que usted lo revise despus cuando le sea ms conveniente.   LO QUE PUEDE ESPERAR: Algunas sensaciones de hinchazn en el abdomen.  Puede tener ms gases de lo normal.  El caminar puede ayudarle a eliminar el aire que se le puso en el tracto gastrointestinal durante el procedimiento y reducir la hinchazn.  Si le hicieron una endoscopia inferior (como una colonoscopia o una sigmoidoscopia flexible), podra notar manchas de sangre en las heces fecales o en el papel higinico.  Si se someti a una preparacin intestinal para su procedimiento, es posible que no tenga una evacuacin intestinal normal durante [employer].   Tenga en cuenta:  Es posible que note un poco de irritacin y congestin en la nariz o algn drenaje.  Esto es debido al oxgeno [employer] durante su procedimiento.  No hay que preocuparse y esto debe desaparecer ms o Regulatory affairs officer.   SNTOMAS PARA REPORTAR INMEDIATAMENTE:  Despus de una endoscopia inferior (colonoscopia o sigmoidoscopia flexible):  Cantidades excesivas de sangre en las heces fecales  Sensibilidad significativa o empeoramiento de los dolores abdominales   Hinchazn aguda del abdomen que antes no tena   Fiebre de 100F o ms   Despus de la endoscopia superior (EGD)  Vmitos de Retail buyer o material como caf molido   Dolor en el pecho o dolor debajo de los omplatos que antes no tena   Dolor o dificultad persistente para tragar  Falta de aire que antes no  tena   Fiebre de 100F o ms  Heces fecales negras y pegajosas   Para asuntos urgentes o de Associate Professor, puede comunicarse con un gastroenterlogo a cualquier hora llamando al [phone].  DIETA:  Recomendamos una comida pequea al principio, pero luego puede continuar con su dieta normal.  Tome muchos lquidos, Tax adviser las bebidas alcohlicas durante 24 horas.    ACTIVIDAD:  Debe planear tomarse las cosas con calma por el resto del da y no debe CONDUCIR ni usar maquinaria pesada Patent examiner (debido a los medicamentos de sedacin utilizados durante el examen).     SEGUIMIENTO: Nuestro personal llamar al nmero que aparece en su historial al siguiente da hbil de su procedimiento para ver cmo se siente y para responder cualquier pregunta o inquietud que pueda tener con respecto a la informacin que se le dio despus del procedimiento. Si no podemos contactarle, le dejaremos un mensaje.  Sin embargo, si se siente bien y no tiene English as a second language teacher, no es necesario que nos devuelva la llamada.  Asumiremos que ha regresado a sus actividades diarias normales sin incidentes. Si se le tomaron algunas biopsias, le contactaremos por telfono o por carta en las prximas 3 semanas.  Si no ha sabido [employer] biopsias en el transcurso de 3 semanas, por favor llmenos al [phone].   FIRMAS/CONFIDENCIALIDAD: Usted y/o el acompaante que le cuide han firmado documentos que se ingresarn en su historial mdico electrnico.  Estas firmas atestiguan el hecho  de que la informacin anterior   YOU HAD AN ENDOSCOPIC PROCEDURE TODAY AT THE [facility]:   Refer to the procedure report that was given to you for any specific questions about what was found during the examination.  If the procedure report does not answer your questions, please call your gastroenterologist to clarify.  If you requested that your care partner not be given the details of your procedure findings, then the  procedure report has been included in a sealed envelope for you to review at your convenience later.  YOU SHOULD EXPECT: Some feelings of bloating in the abdomen. Passage of more gas than usual.  Walking can help get rid of the air that was put into your GI tract during the procedure and reduce the bloating. If you had a lower endoscopy (such as a colonoscopy or flexible sigmoidoscopy) you may notice spotting of blood in your stool or on the toilet paper. If you underwent a bowel prep for your procedure, you may not have a normal bowel movement for a few days.  Please Note:  You might notice some irritation and congestion in your nose or some drainage.  This is from the oxygen used during your procedure.  There is no need for concern and it should clear up in a day or so.  SYMPTOMS TO REPORT IMMEDIATELY:  Following upper endoscopy (EGD)  Vomiting of blood or coffee ground material  New chest pain or pain under the shoulder blades  Painful or persistently difficult swallowing  New shortness of breath  Fever of 100F or higher  Black, tarry-looking stools  For urgent or emergent issues, a gastroenterologist can be reached at any hour by calling [phone]. Do not use MyChart messaging for urgent concerns.    DIET:  We do recommend a small meal at first, but then you may proceed to your regular diet.  Drink plenty of fluids but you should avoid alcoholic beverages for 24 hours.  ACTIVITY:  You should plan to take it easy for the rest of today and you should NOT DRIVE or use heavy machinery until tomorrow (because of the sedation medicines used during the test).    FOLLOW UP: Our staff will call the number listed on your records the next business day following your procedure.  We will call around 7:15- 8:00 am to check on you and address any questions or concerns that you may have regarding the information given to you following your procedure. If we do not reach you, we will leave a  message.     If any biopsies were taken you will be contacted by phone or by letter within the next 1-3 weeks.  Please call us at [phone] if you have not heard about the biopsies in 3 weeks.    SIGNATURES/CONFIDENTIALITY: You and/or your care partner have signed paperwork which will be entered into your electronic medical record.  These signatures attest to the fact that that the information above on your After Visit Summary has been reviewed and is understood.  Full responsibility of the confidentiality of this discharge information lies with you and/or your care-partner.

## 2023-11-23 ENCOUNTER — Telehealth: Payer: Self-pay | Admitting: *Deleted

## 2023-11-23 NOTE — Telephone Encounter (Signed)
  Follow up Call-     11/20/2023    9:18 AM  Call back number  Post procedure Call Back phone  # [phone]- needs spanish interpreter  Permission to leave phone message Yes     Patient questions:  Do you have a fever, pain , or abdominal swelling? No. Pain Score  0 *  Have you tolerated food without any problems? Yes.    Have you been able to return to your normal activities? Yes.    Do you have any questions about your discharge instructions: Diet   No. Medications  No. Follow up visit  No.  Do you have questions or concerns about your Care? No.  Actions: * If pain score is 4 or above: No action needed, pain <4.

## 2023-11-24 LAB — SURGICAL PATHOLOGY

## 2023-11-26 ENCOUNTER — Encounter: Payer: Self-pay | Admitting: Gastroenterology

## 2023-12-17 ENCOUNTER — Other Ambulatory Visit: Payer: Self-pay

## 2023-12-17 ENCOUNTER — Ambulatory Visit (INDEPENDENT_AMBULATORY_CARE_PROVIDER_SITE_OTHER): Payer: Self-pay | Admitting: Obstetrics and Gynecology

## 2023-12-17 VITALS — BP 127/91 | HR 94 | Wt 131.0 lb

## 2023-12-17 DIAGNOSIS — Z975 Presence of (intrauterine) contraceptive device: Secondary | ICD-10-CM

## 2023-12-17 DIAGNOSIS — N83202 Unspecified ovarian cyst, left side: Secondary | ICD-10-CM

## 2023-12-17 NOTE — Progress Notes (Signed)
 NEW GYNECOLOGY PATIENT Patient name: April Taylor MRN [id number]  Date of birth: 1986-04-28 Chief Complaint:   Gynecologic Exam     History:  April Taylor is a 38 y.o. G3P3003 being seen today for incidental ovarian cyst. Has some irregular bleeding with nexplanon - states saturating a pad w/ chagning a pad every hour and will have another cycle about 15 days later. [facility]. Not pleased with bleeding pattern with nexplanon and would want it removed. No pain with intercourse, no pelvic pain.      Gynecologic History Patient's last menstrual period was 11/04/2023 (within days). Contraception: Nexplanon placed 06/2023 Last Pap: reports last year at [facility] Last Mammogram: n/a Last Colonoscopy: n/a  Obstetric History OB History  Gravida Para Term Preterm AB Living  3 3 3  0 0 3  SAB IAB Ectopic Multiple Live Births  0 0 0 0 3    # Outcome Date GA Lbr Len/2nd Weight Sex Type Anes PTL Lv  3 Term 10/25/19 [redacted]w[redacted]d 01:24 / 00:20 6 lb 8.1 oz (2.951 kg) M Vag-Spont EPI  LIV  2 Term 02/08/11 [redacted]w[redacted]d  6 lb 13 oz (3.09 kg) M      1 Term 04/06/05 [redacted]w[redacted]d  6 lb (2.722 kg) M Vag-Spont EPI N LIV    Past Medical History:  Diagnosis Date   Cholelithiasis    Hypertension    Retained placenta 10/25/2019   Supervision of normal pregnancy 12/30/2010   Clinic [facility] Prenatal Labs Dating LMP 01/20/19 Blood type: A/Positive/-- (06/19 1046)  Genetic Screen 1 Screen:    AFP:     Quad:     NIPS: Declined Antibody:Negative (06/19 1046) Anatomic [place] Female  Rubella: 6.68 (06/19 1046) GTT Elevated, scheduled for 3 hour- normal  RPR: Non Reactive (10/15 1058)  Flu vaccine 08/18/19 HBsAg: Negative (06/19 1046)  TDaP vaccine        08/18/19                 Rhogam    Past Surgical History:  Procedure Laterality Date   NO PAST SURGERIES     UPPER GASTROINTESTINAL ENDOSCOPY      Current Outpatient Medications on File Prior to Visit  Medication Sig Dispense Refill    amLODipine (NORVASC) 5 MG tablet Take 5 mg by mouth daily.     etonogestrel (NEXPLANON) 68 MG IMPL implant 1 each by Subdermal route once.     acetaminophen (TYLENOL) 325 MG tablet Take 650 mg by mouth every 6 (six) hours as needed. (Patient not taking: Reported on 10/29/2023)     famotidine (PEPCID) 20 MG tablet Take 1 tablet (20 mg total) by mouth at bedtime. (Patient not taking: Reported on 12/17/2023) 30 tablet 1   No current facility-administered medications on file prior to visit.    No Known Allergies  Social History:  reports that she has never smoked. She has never used smokeless tobacco. She reports that she does not drink alcohol and does not use drugs.  Family History  Problem Relation Age of Onset   Osteoarthritis Mother    Hypertension Son    Colon cancer Neg Hx    Esophageal cancer Neg Hx    Rectal cancer Neg Hx    Stomach cancer Neg Hx     The following portions of the patient's history were reviewed and updated as appropriate: allergies, current medications, past family history, past medical history, past social history, past surgical history and problem list.  Review of  Systems Pertinent items noted in HPI and remainder of comprehensive ROS otherwise negative.  Physical Exam:  BP (!) 127/91   Pulse 94   Wt 131 lb (59.4 kg)   LMP 11/04/2023 (Within Days)   BMI 24.96 kg/m  Physical Exam Vitals and nursing note reviewed.  Constitutional:      Appearance: Normal appearance.  Cardiovascular:     Rate and Rhythm: Normal rate.  Pulmonary:     Effort: Pulmonary effort is normal.     Breath sounds: Normal breath sounds.  Abdominal:     Palpations: Abdomen is soft.     Tenderness: There is no abdominal tenderness.  Neurological:     General: No focal deficit present.     Mental Status: She is alert and oriented to person, place, and time.  Psychiatric:        Mood and Affect: Mood normal.        Behavior: Behavior normal.        Thought Content: Thought  content normal.        Judgment: Judgment normal.    Imaging Abdominal ultrasound from 04/2023 ([place] for RUQ pain) "Incidental note is made of a left ovarian cyst measures up to 3.4cm internal echoes may related to blood products. Recommend follow-up ultrasound in 6 weeks to document resolution"   Assessment and Plan:   1. Cyst of left ovary (Primary) Noted incidental finding of ovarian cyst at time of abdominal ultrasound without pelvic complaints. Discussed cyst has likely resolved at this point and without symptoms, can defer repeat ultrasound for now. If new pain develops, recommend repeat ultrasound. Otherwise, no additional intervention needed at this time.   2. Nexplanon in place Displease with bleeding pattern with nexplanon in place and would like to have it removed to switch to other form of contraception. Recommend follow up with [facility] to mitigate cost of service to remove.    Routine preventative health maintenance measures emphasized. Please refer to After Visit Summary for other counseling recommendations.   Follow-up: No follow-ups on file.      April Shire, MD Obstetrician & Gynecologist, [facility] [employer], [facility]

## 2024-03-29 ENCOUNTER — Encounter: Payer: Self-pay | Admitting: *Deleted

## 2024-05-10 ENCOUNTER — Ambulatory Visit: Payer: Self-pay | Admitting: Orthopaedic Surgery

## 2024-05-25 ENCOUNTER — Ambulatory Visit (HOSPITAL_COMMUNITY)
Admission: EM | Admit: 2024-05-25 | Discharge: 2024-05-25 | Disposition: A | Discharge status: Home / Self Care | Payer: Self-pay | Attending: Internal Medicine | Admitting: Internal Medicine

## 2024-05-25 ENCOUNTER — Encounter (HOSPITAL_COMMUNITY): Payer: Self-pay

## 2024-05-25 DIAGNOSIS — Z789 Other specified health status: Secondary | ICD-10-CM

## 2024-05-25 DIAGNOSIS — R197 Diarrhea, unspecified: Secondary | ICD-10-CM

## 2024-05-25 MED ORDER — ONDANSETRON 4 MG PO TBDP
4.0000 mg | ORAL_TABLET | Freq: Once | ORAL | Status: AC
  Administered 2024-05-25: 4 mg via ORAL

## 2024-05-25 MED ORDER — ONDANSETRON 4 MG PO TBDP
ORAL_TABLET | ORAL | Status: AC
  Filled 2024-05-25: qty 1

## 2024-05-25 MED ORDER — ONDANSETRON 4 MG PO TBDP: 4.0000 mg | ORAL_TABLET | Freq: Three times a day (TID) | ORAL | 0 refills | Status: AC | PRN

## 2024-05-25 NOTE — Discharge Instructions (Signed)
 Your evaluation suggests that your symptoms are most likely due to viral stomach illness (gastroenteritis/stomach bug) which will improve on its own with rest and fluids in the next few days.   I have ordered a stool culture to be ran to evaluate for infectious causes of diarrhea.  This will come back in the next few days.  Staff will call if abnormal.  Take zofran  to help with nausea every 8 hours as needed. You may use over the counter medicines for aches and pains such as tylenol  as needed.  Start sipping on liquids (broth, water, gatorade, etc). If you are able to keep liquids down without vomiting for 1-2 hours, you may eat bland foods like jello, pudding, applesauce, bananas, rice, and white toast. Once you can tolerate blands and your diarrhea has improved, you may return to normal diet.  Pedialyte or gatorolyte may help to prevent/fix dehydration due to vomiting and diarrhea.  Please follow up with your primary care provider for further management. Return if you experience worsening or uncontrolled pain, inability to tolerate fluids by mouth, difficulty breathing, fevers 100.26F or greater, recurrent vomiting, or any other concerning symptoms.

## 2024-05-25 NOTE — ED Provider Notes (Signed)
 [facility]    CSN: [id number] Arrival date & time: 05/25/24  1722      History   Chief Complaint Chief Complaint  Patient presents with  . Abdominal Pain    HPI April Taylor is a 38 y.o. female.   April Taylor is a 38 y.o. female presenting for chief complaint of Abdominal Pain, nausea, and diarrhea that started 4 days ago.  She recently traveled to [place] and states she did drink the water while she was there.  No recent sick contacts with similar symptoms.  She has not vomited and complains of mostly upper central abdominal pain to the epigastrium.  Denies shortness of breath, chest pain, blood/mucus in the stools, fever, chills, dizziness, syncope, and urinary symptoms.  She recently ended her menstrual cycle (LMP 05-22-2024).  Denies chance of pregnancy.  No recent antibiotic or steroid use reported.  No history of immunosuppression.  Drinking plenty of fluids and eating bland foods.  Denies recent intake of spicy/acidic foods, alcohol, NSAIDs, etc.  No history of GERD, no acid reflux reported today.  She has not attempted use of any over-the-counter medications to relieve symptoms PTA.   Abdominal Pain   Past Medical History:  Diagnosis Date  . Cholelithiasis   . Hypertension   . Retained placenta 10/25/2019  . Supervision of normal pregnancy 12/30/2010   Clinic [facility] Prenatal Labs Dating LMP 01/20/19 Blood type: A/Positive/-- (06/19 1046)  Genetic Screen 1 Screen:    AFP:     Quad:     NIPS: Declined Antibody:Negative (06/19 1046) Anatomic US  Female  Rubella: 6.68 (06/19 1046) GTT Elevated, scheduled for 3 hour- normal  RPR: Non Reactive (10/15 1058)  Flu vaccine 08/18/19 HBsAg: Negative (06/19 1046)  TDaP vaccine        08/18/19                 Rhogam    Patient Active Problem List   Diagnosis Date Noted  . RUQ pain 10/29/2023  . Vertigo, benign paroxysmal 05/08/2020  . Birth control counseling 02/01/2020  . Labor and delivery, indication for  care 10/25/2019  . AMA (advanced maternal age) multigravida 35+ 10/25/2019  . Severe preeclampsia 10/25/2019  . Abnormal ultrasound 07/13/2019  . GBS bacteriuria 04/27/2019  . Herpes labialis 01/12/2013  . Psoriasis 06/09/2011    Past Surgical History:  Procedure Laterality Date  . NO PAST SURGERIES    . UPPER GASTROINTESTINAL ENDOSCOPY      OB History     Gravida  3   Para  3   Term  3   Preterm  0   AB  0   Living  3      SAB  0   IAB  0   Ectopic  0   Multiple  0   Live Births  3            Home Medications    Prior to Admission medications   Medication Sig Start Date End Date Taking? Authorizing Provider  acetaminophen  (TYLENOL ) 325 MG tablet Take 650 mg by mouth every 6 (six) hours as needed. Patient not taking: Reported on 10/29/2023    [provider]  amLODipine  (NORVASC ) 5 MG tablet Take 5 mg by mouth daily.    [provider]  etonogestrel (NEXPLANON) 68 MG IMPL implant 1 each by Subdermal route once. 10/28/22   [provider]  famotidine  (PEPCID ) 20 MG tablet Take 1 tablet (20 mg total) by mouth at bedtime.  Patient not taking: Reported on 12/17/2023 10/29/23   Cara Elida HERO, NP    Family History Family History  Problem Relation Age of Onset  . Osteoarthritis Mother   . Hypertension Son   . Colon cancer Neg Hx   . Esophageal cancer Neg Hx   . Rectal cancer Neg Hx   . Stomach cancer Neg Hx     Social History Social History   Tobacco Use  . Smoking status: Never  . Smokeless tobacco: Never  Vaping Use  . Vaping status: Never Used  Substance Use Topics  . Alcohol use: Yes    Comment: Occasionally  . Drug use: No     Allergies   Patient has no known allergies.   Review of Systems Review of Systems  Gastrointestinal:  Positive for abdominal pain.     Physical Exam Triage Vital Signs ED Triage Vitals  Encounter Vitals Group     BP 05/25/24 1745 (!) 149/97     Girls Systolic BP  Percentile --      Girls Diastolic BP Percentile --      Boys Systolic BP Percentile --      Boys Diastolic BP Percentile --      Pulse Rate 05/25/24 1745 83     Resp 05/25/24 1745 16     Temp 05/25/24 1745 98.3 F (36.8 C)     Temp Source 05/25/24 1745 Oral     SpO2 05/25/24 1745 98 %     Weight --      Height --      Head Circumference --      Peak Flow --      Pain Score 05/25/24 1746 6     Pain Loc --      Pain Education --      Exclude from Growth Chart --    No data found.  Updated Vital Signs BP (!) 149/97 (BP Location: Right Arm)   Pulse 83   Temp 98.3 F (36.8 C) (Oral)   Resp 16   LMP 05/22/2024 (Exact Date)   SpO2 98%   Visual Acuity Right Eye Distance:   Left Eye Distance:   Bilateral Distance:    Right Eye Near:   Left Eye Near:    Bilateral Near:     Physical Exam Vitals and nursing note reviewed.  Constitutional:      Appearance: She is not ill-appearing or toxic-appearing.  HENT:     Head: Normocephalic and atraumatic.     Right Ear: Hearing and external ear normal.     Left Ear: Hearing and external ear normal.     Nose: Nose normal.     Mouth/Throat:     Lips: Pink.  Eyes:     General: Lids are normal. Vision grossly intact. Gaze aligned appropriately.     Extraocular Movements: Extraocular movements intact.     Conjunctiva/sclera: Conjunctivae normal.  Cardiovascular:     Rate and Rhythm: Normal rate and regular rhythm.     Heart sounds: Normal heart sounds, S1 normal and S2 normal.  Pulmonary:     Effort: Pulmonary effort is normal. No respiratory distress.     Breath sounds: Normal breath sounds and air entry.  Abdominal:     General: Abdomen is flat. Bowel sounds are normal.     Palpations: Abdomen is soft.     Tenderness: There is abdominal tenderness in the right lower quadrant and left lower quadrant. There is no right CVA tenderness, left CVA  tenderness or guarding.     Comments: No peritoneal signs to exam.   Musculoskeletal:      Cervical back: Neck supple.  Skin:    General: Skin is warm and dry.     Capillary Refill: Capillary refill takes less than 2 seconds.     Findings: No rash.  Neurological:     General: No focal deficit present.     Mental Status: She is alert and oriented to person, place, and time. Mental status is at baseline.     Cranial Nerves: No dysarthria or facial asymmetry.  Psychiatric:        Mood and Affect: Mood normal.        Speech: Speech normal.        Behavior: Behavior normal.        Thought Content: Thought content normal.        Judgment: Judgment normal.      UC Treatments / Results  Labs (all labs ordered are listed, but only abnormal results are displayed) Labs Reviewed - No data to display  EKG   Radiology No results found.  Procedures Procedures (including critical care time)  Medications Ordered in UC Medications  ondansetron  (ZOFRAN -ODT) disintegrating tablet 4 mg (has no administration in time range)    Initial Impression / Assessment and Plan / UC Course  I have reviewed the triage vital signs and the nursing notes.  Pertinent labs & imaging results that were available during my care of the patient were reviewed by me and considered in my medical decision making (see chart for details).     *** Final Clinical Impressions(s) / UC Diagnoses   Final diagnoses:  Diarrhea, unspecified type   Discharge Instructions   None    ED Prescriptions   None    PDMP not reviewed this encounter.

## 2024-05-25 NOTE — ED Triage Notes (Signed)
 Patient here today with c/o abd pain, diarrhea, and a little nausea X 4 days. Patient has tried taking Maalox and a medication from [place] with no relief.

## 2024-08-03 ENCOUNTER — Ambulatory Visit: Payer: Self-pay

## 2024-08-03 DIAGNOSIS — Z23 Encounter for immunization: Secondary | ICD-10-CM
